# Patient Record
Sex: Female | Born: 2000 | Race: White | Hispanic: No | Marital: Single | Smoking: Current every day smoker
Health system: Southern US, Community
[De-identification: ages and names within clinical notes are randomized; demographics above are authoritative.]

## PROBLEM LIST (undated history)

## (undated) DIAGNOSIS — C801 Malignant (primary) neoplasm, unspecified: Secondary | ICD-10-CM

## (undated) DIAGNOSIS — F32A Depression, unspecified: Secondary | ICD-10-CM

## (undated) DIAGNOSIS — F419 Anxiety disorder, unspecified: Secondary | ICD-10-CM

## (undated) DIAGNOSIS — I499 Cardiac arrhythmia, unspecified: Secondary | ICD-10-CM

## (undated) DIAGNOSIS — J45909 Unspecified asthma, uncomplicated: Secondary | ICD-10-CM

## (undated) DIAGNOSIS — E039 Hypothyroidism, unspecified: Secondary | ICD-10-CM

## (undated) DIAGNOSIS — E079 Disorder of thyroid, unspecified: Secondary | ICD-10-CM

## (undated) HISTORY — DX: Hypothyroidism, unspecified: E03.9

## (undated) HISTORY — DX: Disorder of thyroid, unspecified: E07.9

## (undated) HISTORY — PX: NO PAST SURGERIES: SHX2092

---
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
## 2019-09-03 ENCOUNTER — Ambulatory Visit
Admission: EM | Admit: 2019-09-03 | Discharge: 2019-09-03 | Disposition: A | Discharge status: Home / Self Care | Payer: Medicaid Other | Attending: Emergency Medicine | Admitting: Emergency Medicine

## 2019-09-03 DIAGNOSIS — Z3202 Encounter for pregnancy test, result negative: Secondary | ICD-10-CM

## 2019-09-03 DIAGNOSIS — N939 Abnormal uterine and vaginal bleeding, unspecified: Secondary | ICD-10-CM

## 2019-09-03 DIAGNOSIS — R109 Unspecified abdominal pain: Secondary | ICD-10-CM

## 2019-09-03 LAB — POCT URINALYSIS DIP (MANUAL ENTRY)
Glucose, UA: NEGATIVE mg/dL
Leukocytes, UA: NEGATIVE
Nitrite, UA: NEGATIVE
Protein Ur, POC: 30 mg/dL — AB
Spec Grav, UA: 1.025 (ref 1.010–1.025)
Urobilinogen, UA: 0.2 E.U./dL
pH, UA: 5.5 (ref 5.0–8.0)

## 2019-09-03 LAB — POCT URINE PREGNANCY: Preg Test, Ur: NEGATIVE

## 2019-09-03 MED ORDER — MEDROXYPROGESTERONE ACETATE 10 MG PO TABS: 10.0000 mg | ORAL_TABLET | Freq: Every day | ORAL | 0 refills | Status: DC

## 2019-09-03 MED ORDER — ONDANSETRON HCL 4 MG PO TABS: 4.0000 mg | ORAL_TABLET | Freq: Four times a day (QID) | ORAL | 0 refills | Status: DC

## 2019-09-03 MED ORDER — MELOXICAM 7.5 MG PO TABS: 7.5000 mg | ORAL_TABLET | Freq: Every day | ORAL | 0 refills | Status: DC

## 2019-09-03 NOTE — ED Triage Notes (Signed)
Pt  Took off birth control patch  And has had some bleeding, pt states she had a large amount of tissue last night and is having heavy bleeding , wants to make sure not miscarriage

## 2019-09-03 NOTE — Discharge Instructions (Signed)
Unable to rule out ectopic/ molar pregnancy, ovarian torsion, ovarian cyst, diverticulitis, appendicitis, tuboovarian abscess, etc... in urgent care setting.  Offered patient further evaluation and management in the ED.  Patient declines at this time and would like to try outpatient therapy first.  Aware of the risk associated with this decision including missed diagnosis, organ damage, organ failure, and/or death.  Patient aware and in agreement.     Declines blood tests at this time including quantitative HCG Get rest and drink fluids Zofran as needed for nausea Mobic as needed for pain.  Avoid taking with other NSAIDs at this may cause GI upset Provera prescribed.  Take as directed for vaginal bleeding Follow up with PCP for further evaluation and management Go to the ED if you have fever, chills, nausea, vomiting, persistent vaginal bleeding, worsening pain, diarrhea, etc..Marland Kitchen

## 2019-09-03 NOTE — ED Provider Notes (Signed)
[place]   [id number] 09/03/19 Arrival Time: 0998  CC: Vaginal bleeding  SUBJECTIVE:  Monique Long is a 19 y.o. female who presents with complaint of vaginal bleeding (bleeding through super tampon every 30 minutes) x 4 days, and lower abdominal cramping x 1 week.  Discontinued birth control patch prior to symptoms, last sex 3 days ago, sex also apx 10 days ago. Has tried OTC medications without relief.  Denies alleviating or aggravating factors.  Denies similar symptoms in the past.  Complains of nausea, 1 episode of vomiting, fatigue, nipple tenderness, and mild appetite change.    Denies fever, chills, chest pain, SOB, diarrhea, constipation, dysuria, difficulty urinating, increased frequency or urgency, flank pain, loss of bowel or bladder function, vaginal discharge, vaginal odor, dyspareunia, pelvic pain.     No LMP recorded. (Menstrual status: Other).  ROS: As per HPI.  All other pertinent ROS negative.     No past medical history on file. No past surgical history on file. Allergies  Allergen Reactions  . Penicillins Rash   No current facility-administered medications on file prior to encounter.   No current outpatient medications on file prior to encounter.   Social History   Socioeconomic History  . Marital status: Single    Spouse name: Not on file  . Number of children: Not on file  . Years of education: Not on file  . Highest education level: Not on file  Occupational History  . Not on file  Tobacco Use  . Smoking status: Never Smoker  . Smokeless tobacco: Never Used  Substance and Sexual Activity  . Alcohol use: No  . Drug use: Not Currently  . Sexual activity: Not on file  Other Topics Concern  . Not on file  Social History Narrative  . Not on file   Social Determinants of Health   Financial Resource Strain:   . Difficulty of Paying Living Expenses:   Food Insecurity:   . Worried About Charity fundraiser in the Last Year:   . Youth worker in the Last Year:   Transportation Needs:   . Film/video editor (Medical):   Marland Kitchen Lack of Transportation (Non-Medical):   Physical Activity:   . Days of Exercise per Week:   . Minutes of Exercise per Session:   Stress:   . Feeling of Stress :   Social Connections:   . Frequency of Communication with Friends and Family:   . Frequency of Social Gatherings with Friends and Family:   . Attends Religious Services:   . Active Member of Clubs or Organizations:   . Attends Archivist Meetings:   Marland Kitchen Marital Status:   Intimate Partner Violence:   . Fear of Current or Ex-Partner:   . Emotionally Abused:   Marland Kitchen Physically Abused:   . Sexually Abused:    Family History  Problem Relation Age of Onset  . Cancer Father      OBJECTIVE:  Vitals:   09/03/19 1402  BP: 119/75  Pulse: 67  Resp: 18  Temp: 98.3 F (36.8 C)  SpO2: 98%    General appearance: Alert; NAD HEENT: NCAT.  Oropharynx clear.  Lungs: clear to auscultation bilaterally without adventitious breath sounds Heart: regular rate and rhythm.   Abdomen: soft, non-distended; normal active bowel sounds; mild diffuse TTP over lower abdomen; no guarding Extremities: no edema; symmetrical with no gross deformities Skin: warm and dry Neurologic: normal gait Psychological: alert and cooperative; normal mood and affect  LABS: Results for orders placed or performed during the hospital encounter of 09/03/19 (from the past 24 hour(s))  POCT urinalysis dipstick     Status: Abnormal   Collection Time: 09/03/19  2:11 PM  Result Value Ref Range   Color, UA yellow yellow   Clarity, UA clear clear   Glucose, UA negative negative mg/dL   Bilirubin, UA small (A) negative   Ketones, POC UA small (15) (A) negative mg/dL   Spec Grav, UA 1.025 1.010 - 1.025   Blood, UA trace-intact (A) negative   pH, UA 5.5 5.0 - 8.0   Protein Ur, POC =30 (A) negative mg/dL   Urobilinogen, UA 0.2 0.2 or 1.0 E.U./dL   Nitrite, UA Negative  Negative   Leukocytes, UA Negative Negative  POCT urine pregnancy     Status: None   Collection Time: 09/03/19  2:11 PM  Result Value Ref Range   Preg Test, Ur Negative Negative    ASSESSMENT & PLAN:  1. Abnormal vaginal bleeding   2. Abdominal cramping   3. Negative pregnancy test     Meds ordered this encounter  Medications  . ondansetron (ZOFRAN) 4 MG tablet    Sig: Take 1 tablet (4 mg total) by mouth every 6 (six) hours.    Dispense:  12 tablet    Refill:  0    Order Specific Question:   Supervising Provider    Answer:   Raylene Everts [[id number]]  . meloxicam (MOBIC) 7.5 MG tablet    Sig: Take 1 tablet (7.5 mg total) by mouth daily.    Dispense:  20 tablet    Refill:  0    Order Specific Question:   Supervising Provider    Answer:   Raylene Everts [[id number]]  . medroxyPROGESTERone (PROVERA) 10 MG tablet    Sig: Take 1 tablet (10 mg total) by mouth daily for 10 days.    Dispense:  10 tablet    Refill:  0    Order Specific Question:   Supervising Provider    Answer:   Raylene Everts [[id number]]   Unable to rule out ectopic/ molar pregnancy, ovarian torsion, ovarian cyst, diverticulitis, appendicitis, tuboovarian abscess, etc... in urgent care setting.  Offered patient further evaluation and management in the ED.  Patient declines at this time and would like to try outpatient therapy first.  Aware of the risk associated with this decision including missed diagnosis, organ damage, organ failure, and/or death.  Patient aware and in agreement.     Declines blood tests at this time including quantitative HCG Get rest and drink fluids Zofran as needed for nausea Mobic as needed for pain.  Avoid taking with other NSAIDs at this may cause GI upset Provera prescribed.  Take as directed for vaginal bleeding Follow up with PCP for further evaluation and management Go to the ED if you have fever, chills, nausea, vomiting, persistent vaginal bleeding, worsening pain,  diarrhea, etc...   Reviewed expectations re: course of current medical issues. Questions answered. Outlined signs and symptoms indicating need for more acute intervention. Patient verbalized understanding. After Visit Summary given.   Lestine Box, PA-C 09/03/19 1443

## 2019-09-25 ENCOUNTER — Ambulatory Visit
Admission: EM | Admit: 2019-09-25 | Discharge: 2019-09-25 | Disposition: A | Discharge status: Home / Self Care | Payer: Medicaid Other | Attending: Emergency Medicine | Admitting: Emergency Medicine

## 2019-09-25 ENCOUNTER — Other Ambulatory Visit: Payer: Self-pay

## 2019-09-25 ENCOUNTER — Encounter: Payer: Self-pay | Admitting: Emergency Medicine

## 2019-09-25 DIAGNOSIS — R112 Nausea with vomiting, unspecified: Secondary | ICD-10-CM

## 2019-09-25 DIAGNOSIS — Z3202 Encounter for pregnancy test, result negative: Secondary | ICD-10-CM

## 2019-09-25 LAB — POCT URINE PREGNANCY: Preg Test, Ur: NEGATIVE

## 2019-09-25 MED ORDER — ONDANSETRON HCL 4 MG PO TABS: 4.0000 mg | ORAL_TABLET | Freq: Three times a day (TID) | ORAL | 0 refills | Status: DC | PRN

## 2019-09-25 NOTE — ED Triage Notes (Signed)
Pt reports she has nauseated since yesterday. Pt reports she is not sure if she is pregnant.  Took 2 pregnancy test but didn't show anything.  Would like nausea med and not for work today.

## 2019-09-25 NOTE — ED Provider Notes (Signed)
[place]   [id number] 09/25/19 Arrival Time: 6270  Chief Complaint  Patient presents with  . Nausea     SUBJECTIVE:  Monique Long is a 19 y.o. female who presents presented to the urgent care with a complaint of nausea and vomiting that started yesterday..  Denies a precipitating event, trauma, close contacts with similar symptoms, recent travel or antibiotic use.  Reports she is not sure if she is pregnant.  Has used two home pregnancy test with negative results.  Has not vomited today.  Has not tried OTC medications .  Denies alleviating or aggravating factors.  Denies similar symptoms in the past.  Denies chills, fever, vomiting, diarrhea, abdominal pain, back pain, dysuria, bloody stool or mucoid stool.  No LMP recorded (lmp unknown).  ROS: As per HPI.  All other pertinent ROS negative.     History reviewed. No pertinent past medical history. History reviewed. No pertinent surgical history. Allergies  Allergen Reactions  . Penicillins Rash   No current facility-administered medications on file prior to encounter.   Current Outpatient Medications on File Prior to Encounter  Medication Sig Dispense Refill  . medroxyPROGESTERone (PROVERA) 10 MG tablet Take 1 tablet (10 mg total) by mouth daily for 10 days. 10 tablet 0  . meloxicam (MOBIC) 7.5 MG tablet Take 1 tablet (7.5 mg total) by mouth daily. 20 tablet 0   Social History   Socioeconomic History  . Marital status: Single    Spouse name: Not on file  . Number of children: Not on file  . Years of education: Not on file  . Highest education level: Not on file  Occupational History  . Not on file  Tobacco Use  . Smoking status: Never Smoker  . Smokeless tobacco: Never Used  Substance and Sexual Activity  . Alcohol use: No  . Drug use: Not Currently  . Sexual activity: Not on file  Other Topics Concern  . Not on file  Social History Narrative  . Not on file   Social Determinants of Health    Financial Resource Strain:   . Difficulty of Paying Living Expenses:   Food Insecurity:   . Worried About Charity fundraiser in the Last Year:   . Arboriculturist in the Last Year:   Transportation Needs:   . Film/video editor (Medical):   Marland Kitchen Lack of Transportation (Non-Medical):   Physical Activity:   . Days of Exercise per Week:   . Minutes of Exercise per Session:   Stress:   . Feeling of Stress :   Social Connections:   . Frequency of Communication with Friends and Family:   . Frequency of Social Gatherings with Friends and Family:   . Attends Religious Services:   . Active Member of Clubs or Organizations:   . Attends Archivist Meetings:   Marland Kitchen Marital Status:   Intimate Partner Violence:   . Fear of Current or Ex-Partner:   . Emotionally Abused:   Marland Kitchen Physically Abused:   . Sexually Abused:    Family History  Problem Relation Age of Onset  . Cancer Father      OBJECTIVE:  Vitals:   09/25/19 1425 09/25/19 1428  BP:  95/63  Pulse:  91  Resp:  17  Temp:  98.4 F (36.9 C)  TempSrc:  Oral  SpO2:  98%  Weight: 130 lb (59 kg)   Height: 5\' 5"  (1.651 m)     General appearance: Alert; NAD  HEENT: NCAT.  Oropharynx clear.  Lungs: clear to auscultation bilaterally without adventitious breath sounds Heart: regular rate and rhythm.  Radial pulses 2+ symmetrical bilaterally Abdomen: soft, non-distended; normal active bowel sounds; non-tender to light and deep palpation; nontender at McBurney's point; negative Murphy's sign; negative rebound; no guarding Back: no CVA tenderness Extremities: no edema; symmetrical with no gross deformities Skin: warm and dry Neurologic: normal gait Psychological: alert and cooperative; normal mood and affect  LABS: Results for orders placed or performed during the hospital encounter of 09/25/19 (from the past 24 hour(s))  POCT urine pregnancy     Status: None   Collection Time: 09/25/19  2:34 PM  Result Value Ref Range    Preg Test, Ur Negative Negative    DIAGNOSTIC STUDIES: No results found.   ASSESSMENT & PLAN:  1. Non-intractable vomiting with nausea, unspecified vomiting type   2. Urine pregnancy test negative     Meds ordered this encounter  Medications  . ondansetron (ZOFRAN) 4 MG tablet    Sig: Take 1 tablet (4 mg total) by mouth every 8 (eight) hours as needed for nausea or vomiting.    Dispense:  20 tablet    Refill:  0    Discharge Instructions Urine pregnancy test is negative Get rest and drink fluids Zofran prescribed.  Take as directed.    DIET Instructions:  30 minutes after taking nausea medicine, begin with sips of clear liquids. If able to hold down 2 - 4 ounces for 30 minutes, begin drinking more. Increase your fluid intake to replace losses. Clear liquids only for 24 hours (water, tea, sport drinks, clear flat ginger ale or cola and juices, broth, jello, popsicles, ect). Advance to bland foods, applesauce, rice, baked or boiled chicken, ect. Avoid milk, greasy foods and anything that doesn't agree with you.  If you experience new or worsening symptoms return or go to ER such as fever, chills, nausea, vomiting, diarrhea, bloody or dark tarry stools, constipation, urinary symptoms, worsening abdominal discomfort, symptoms that do not improve with medications, inability to keep fluids down, etc...  Reviewed expectations re: course of current medical issues. Questions answered. Outlined signs and symptoms indicating need for more acute intervention. Patient verbalized understanding. After Visit Summary given.       Note: This document was prepared using Dragon voice recognition software and may include unintentional dictation errors.    Emerson Monte, [place] 09/25/19 1444

## 2019-09-25 NOTE — Discharge Instructions (Addendum)
Urine pregnancy test is negative Get rest and drink fluids Zofran prescribed.  Take as directed.    DIET Instructions:  30 minutes after taking nausea medicine, begin with sips of clear liquids. If able to hold down 2 - 4 ounces for 30 minutes, begin drinking more. Increase your fluid intake to replace losses. Clear liquids only for 24 hours (water, tea, sport drinks, clear flat ginger ale or cola and juices, broth, jello, popsicles, ect). Advance to bland foods, applesauce, rice, baked or boiled chicken, ect. Avoid milk, greasy foods and anything that doesn't agree with you.  If you experience new or worsening symptoms return or go to ER such as fever, chills, nausea, vomiting, diarrhea, bloody or dark tarry stools, constipation, urinary symptoms, worsening abdominal discomfort, symptoms that do not improve with medications, inability to keep fluids down, etc

## 2019-11-24 ENCOUNTER — Ambulatory Visit (INDEPENDENT_AMBULATORY_CARE_PROVIDER_SITE_OTHER): Payer: Medicaid Other | Admitting: "Endocrinology

## 2019-11-24 ENCOUNTER — Other Ambulatory Visit: Payer: Self-pay

## 2019-11-24 ENCOUNTER — Encounter: Payer: Self-pay | Admitting: "Endocrinology

## 2019-11-24 VITALS — BP 100/52 | HR 64 | Ht 66.0 in | Wt 129.6 lb

## 2019-11-24 DIAGNOSIS — E063 Autoimmune thyroiditis: Secondary | ICD-10-CM

## 2019-11-24 DIAGNOSIS — E038 Other specified hypothyroidism: Secondary | ICD-10-CM | POA: Diagnosis not present

## 2019-11-24 MED ORDER — LEVOTHYROXINE SODIUM 100 MCG PO TABS: 100.0000 ug | ORAL_TABLET | Freq: Every day | ORAL | 1 refills | Status: DC

## 2019-11-24 NOTE — Progress Notes (Signed)
Endocrinology Consult Note                                         11/24/2019, 3:39 PM   Monique Long Long is a 19 y.o.-year-old female patient being seen in consultation for hypothyroidism referred by her OB/GYN provider Dr. Evie Lacks.   Past Medical History:  Diagnosis Date  . Hypothyroidism     History reviewed. No pertinent surgical history.  Social History   Socioeconomic History  . Marital status: Single    Spouse name: Not on file  . Number of children: Not on file  . Years of education: Not on file  . Highest education level: Not on file  Occupational History  . Not on file  Tobacco Use  . Smoking status: Never Smoker  . Smokeless tobacco: Never Used  Vaping Use  . Vaping Use: Some days  Substance and Sexual Activity  . Alcohol use: No  . Drug use: Not Currently  . Sexual activity: Not on file  Other Topics Concern  . Not on file  Social History Narrative  . Not on file   Social Determinants of Health   Financial Resource Strain:   . Difficulty of Paying Living Expenses: Not on file  Food Insecurity:   . Worried About Charity fundraiser in the Last Year: Not on file  . Ran Out of Food in the Last Year: Not on file  Transportation Needs:   . Lack of Transportation (Medical): Not on file  . Lack of Transportation (Non-Medical): Not on file  Physical Activity:   . Days of Exercise per Week: Not on file  . Minutes of Exercise per Session: Not on file  Stress:   . Feeling of Stress : Not on file  Social Connections:   . Frequency of Communication with Friends and Family: Not on file  . Frequency of Social Gatherings with Friends and Family: Not on file  . Attends Religious Services: Not on file  . Active Member of Clubs or Organizations: Not on file  . Attends Archivist Meetings: Not on file  . Marital Status: Not on file    Family History  Problem Relation Age of Onset   . Hypertension Mother   . Heart failure Mother   . Cancer Father     Outpatient Encounter Medications as of 11/24/2019  Medication Sig  . levothyroxine (SYNTHROID) 100 MCG tablet Take 1 tablet (100 mcg total) by mouth daily before breakfast.  . [DISCONTINUED] levothyroxine (SYNTHROID) 125 MCG tablet Take 1 tablet by mouth daily.  . medroxyPROGESTERone (PROVERA) 10 MG tablet Take 1 tablet (10 mg total) by mouth daily for 10 days.  . ondansetron (ZOFRAN) 4 MG tablet Take 1 tablet (4 mg total) by mouth every 8 (eight) hours as needed for nausea or vomiting.  . [DISCONTINUED] meloxicam (MOBIC) 7.5 MG tablet Take 1 tablet (7.5 mg total) by mouth daily.   No facility-administered encounter  medications on file as of 11/24/2019.    ALLERGIES: Allergies  Allergen Reactions  . Amoxicillin   . Penicillins Rash   VACCINATION STATUS:  There is no immunization history on file for this patient.   HPI    Monique Long Long  is a patient with the above medical history. she was diagnosed  with hypothyroidism approximately a month ago after she dealt with symptoms of fatigue, cold intolerance, constipation. She is 6 months postpartum.  She was initiated and still on levothyroxine 125 mcg p.o. daily with significant clinical improvement.  -She is currently dealing with some anxiety, tremors, palpitations.  Her weight is also fluctuating. I reviewed patient's  thyroid tests: Her most recent TSH on November 18, 2019 was 3.9 improving from 211 on October 11, 2019.  She did have high antithyroid antibodies of TPO at 238 in September 2021.  Pt denies feeling nodules in neck, hoarseness, dysphagia/odynophagia, SOB with lying down.  she reports family history of thyroid dysfunction in one of her aunts taking thyroid hormone replacement.  - No family history of thyroid cancer.  No history of  radiation therapy to head or neck. No recent use of iodine supplements.   ROS:  Constitutional:  + Fluctuating body  weight, + fatigue, no subjective hyperthermia, no subjective hypothermia Eyes: no blurry vision, no xerophthalmia ENT: no sore throat, no nodules palpated in throat, no dysphagia/odynophagia, no hoarseness Cardiovascular: no Chest Pain, no Shortness of Breath, no palpitations, no leg swelling Respiratory: no cough, no SOB Gastrointestinal: no Nausea/Vomiting/Diarhhea Musculoskeletal: no muscle/joint aches Skin: no rashes Neurological: no tremors, no numbness, no tingling, no dizziness Psychiatric: no depression, no anxiety   Physical Exam: BP (!) 100/52   Pulse 64   Ht 5\' 6"  (1.676 m)   Wt 129 lb 9.6 oz (58.8 kg)   BMI 20.92 kg/m  Wt Readings from Last 3 Encounters:  11/24/19 129 lb 9.6 oz (58.8 kg) (53 %, Z= 0.07)*  09/25/19 130 lb (59 kg) (54 %, Z= 0.10)*  02/21/15 130 lb (59 kg) (73 %, Z= 0.63)*   * Growth percentiles are based on CDC (Girls, 2-20 Years) data.    Constitutional:  Body mass index is 20.92 kg/m., not in acute distress, normal state of mind, + tattoos Eyes: PERRLA, EOMI, no exophthalmos ENT: moist mucous membranes, no thyromegaly, no cervical lymphadenopathy Cardiovascular: normal precordial activity, Regular Rate and Rhythm, no Murmur/Rubs/Gallops Respiratory:  adequate breathing efforts, no gross chest deformity, Clear to auscultation bilaterally Gastrointestinal: abdomen soft, Non -tender, No distension, Bowel Sounds present Musculoskeletal: no gross deformities, strength intact in all four extremities Skin: moist, warm, no rashes Neurological: no tremor with outstretched hands, Deep tendon reflexes normal in all four extremities.     ASSESSMENT: 1. Hypothyroidism 2.  Hashimoto's thyroiditis  PLAN:    Patient with recent onset hypothyroidism related to Hashimoto's thyroiditis.  She is currently on levothyroxine likely over replacement dose.   She is approached for lower adjustment on her levothyroxine.  I discussed and lowered it to 100 mcg p.o.  daily before breakfast.    - We discussed about correct intake of levothyroxine, at fasting, with water, separated by at least 30 minutes from breakfast, and separated by more than 4 hours from calcium, iron, multivitamins, acid reflux medications (PPIs). -Patient is made aware of the fact that thyroid hormone replacement is needed for life, dose to be adjusted by periodic monitoring of thyroid function tests. - Will check thyroid tests before next visit: TSH, free T4  before her next visit in 3 months. -Due to absence of clinical goiter, no need for thyroid ultrasound.  She is currently 6 months postpartum, not planning to get pregnant anytime soon, advised to call clinic for adjustment of her thyroid hormone if she confirms any pregnancy.  - Time spent with the patient: 60 minutes, of which >50% was spent in obtaining information about her symptoms, reviewing her previous labs, evaluations, and treatments, counseling her about her hypothyroidism, Hashimoto's thyroiditis, and developing a plan to confirm the diagnosis and long term treatment as necessary. Please refer to " Patient Self Inventory" in the Media  tab for reviewed elements of pertinent patient history.  Monique Long Long participated in the discussions, expressed understanding, and voiced agreement with the above plans.  All questions were answered to her satisfaction. she is encouraged to contact clinic should she have any questions or concerns prior to her return visit.  Return in about 3 months (around 02/23/2020) for F/U with Pre-visit Labs.  Glade Lloyd, MD [facility] [address] [place], [place] [address] Phone: [phone]  Fax: [phone]   11/24/2019, 3:39 PM  This note was partially dictated with voice recognition software. Similar sounding words can be transcribed inadequately or may not  be corrected upon review.

## 2019-12-29 ENCOUNTER — Other Ambulatory Visit: Payer: Self-pay | Admitting: "Endocrinology

## 2019-12-29 ENCOUNTER — Telehealth: Payer: Self-pay | Admitting: "Endocrinology

## 2019-12-29 DIAGNOSIS — E038 Other specified hypothyroidism: Secondary | ICD-10-CM

## 2019-12-29 DIAGNOSIS — E063 Autoimmune thyroiditis: Secondary | ICD-10-CM

## 2019-12-29 NOTE — Telephone Encounter (Signed)
I talked with patient, she already has a lab scheduled with her OB 10/28 and will get them done then. Patient is sch for a phone visit 11/1

## 2019-12-29 NOTE — Telephone Encounter (Signed)
I want her to go to lab for TSH, free T4 and we can do a phone visit.

## 2019-12-29 NOTE — Telephone Encounter (Signed)
Pt has a follow up in December and states she does not feel any better and that she has no energy at all. She would like a call back.

## 2019-12-29 NOTE — Telephone Encounter (Signed)
Pt states she was seen in September and her dose of levothyroxine was decreased to 126mcg. States she is is not feeling any better, she's experiencing worsening fatigue, worsening vaginal dryness and irritation. Pt has an appointment in December but would like to be seen before then.

## 2020-01-07 LAB — TSH: TSH: 2.89 (ref 0.41–5.90)

## 2020-01-10 ENCOUNTER — Ambulatory Visit (INDEPENDENT_AMBULATORY_CARE_PROVIDER_SITE_OTHER): Payer: Medicaid Other | Admitting: "Endocrinology

## 2020-01-10 ENCOUNTER — Telehealth: Payer: Self-pay

## 2020-01-10 ENCOUNTER — Encounter: Payer: Self-pay | Admitting: "Endocrinology

## 2020-01-10 DIAGNOSIS — E063 Autoimmune thyroiditis: Secondary | ICD-10-CM | POA: Diagnosis not present

## 2020-01-10 DIAGNOSIS — E038 Other specified hypothyroidism: Secondary | ICD-10-CM | POA: Diagnosis not present

## 2020-01-10 MED ORDER — PREDNISONE 10 MG PO TABS: 10.0000 mg | ORAL_TABLET | Freq: Every day | ORAL | 0 refills | Status: DC

## 2020-01-10 NOTE — Telephone Encounter (Signed)
Pt called & stated you wanted her to see someone for her stomach. She would like to know who that is? I did not see anything in the chart pertaining to this , please advise

## 2020-01-10 NOTE — Progress Notes (Signed)
01/10/2020, 3:47 PM                                 Endocrinology Telehealth Visit Follow up Note -During COVID -19 Pandemic  This visit type was conducted  via telephone due to national recommendations for restrictions regarding the COVID-19 Pandemic  in an effort to limit this patient's exposure and mitigate transmission of the corona virus.   I connected with [place] on 01/10/2020   by telephone and verified that I am speaking with the correct person using two identifiers. Monique Long, 04-May-2000. she has verbally consented to this visit.  I was in my office and patient was in her residence. No other persons were with me during the encounter. All issues noted in this document were discussed and addressed. The format was not optimal for physical exam.   Monique Long is a 19 y.o.-year-old female patient being engaged in telehealth after she was seen in consultation for hypothyroidism due to Hashimoto's thyroiditis.    Past Medical History:  Diagnosis Date  . Hypothyroidism     History reviewed. No pertinent surgical history.  Social History   Socioeconomic History  . Marital status: Single    Spouse name: Not on file  . Number of children: Not on file  . Years of education: Not on file  . Highest education level: Not on file  Occupational History  . Not on file  Tobacco Use  . Smoking status: Never Smoker  . Smokeless tobacco: Never Used  Vaping Use  . Vaping Use: Some days  Substance and Sexual Activity  . Alcohol use: No  . Drug use: Not Currently  . Sexual activity: Not on file  Other Topics Concern  . Not on file  Social History Narrative  . Not on file   Social Determinants of Health   Financial Resource Strain:   . Difficulty of Paying Living Expenses: Not on file  Food Insecurity:   . Worried About Charity fundraiser in the Last Year: Not  on file  . Ran Out of Food in the Last Year: Not on file  Transportation Needs:   . Lack of Transportation (Medical): Not on file  . Lack of Transportation (Non-Medical): Not on file  Physical Activity:   . Days of Exercise per Week: Not on file  . Minutes of Exercise per Session: Not on file  Stress:   . Feeling of Stress : Not on file  Social Connections:   . Frequency of Communication with Friends and Family: Not on file  . Frequency of Social Gatherings with Friends and Family: Not on file  . Attends Religious Services: Not on file  . Active Member of Clubs or Organizations: Not on file  .  Attends Archivist Meetings: Not on file  . Marital Status: Not on file    Family History  Problem Relation Age of Onset  . Hypertension Mother   . Heart failure Mother   . Cancer Father     Outpatient Encounter Medications as of 01/10/2020  Medication Sig  . levothyroxine (SYNTHROID) 100 MCG tablet Take 1 tablet (100 mcg total) by mouth daily before breakfast.  . medroxyPROGESTERone (PROVERA) 10 MG tablet Take 1 tablet (10 mg total) by mouth daily for 10 days.  . ondansetron (ZOFRAN) 4 MG tablet Take 1 tablet (4 mg total) by mouth every 8 (eight) hours as needed for nausea or vomiting.  . predniSONE (DELTASONE) 10 MG tablet Take 1 tablet (10 mg total) by mouth daily with breakfast.   No facility-administered encounter medications on file as of 01/10/2020.    ALLERGIES: Allergies  Allergen Reactions  . Amoxicillin   . Penicillins Rash   VACCINATION STATUS:  There is no immunization history on file for this patient.   HPI    Wilkinson  is a patient with the above medical history. she was diagnosed  with hypothyroidism approximately 2 months ago after significant suggestive symptoms led to measurement of thyroid function tests.  During her last visit, her levothyroxine was lowered to 100 mcg p.o. daily due to iatrogenic thyrotoxicosis.   Patient still reports  anxiety, nausea, unintended weight loss.  Her previsit labs are consistent with appropriate replacement. -After encounter was closed, she called and reported that she actually has neck pain. Pt denies feeling nodules in neck, hoarseness, dysphagia/odynophagia, SOB with lying down.  she reports family history of thyroid dysfunction in one of her aunts taking thyroid hormone replacement.  - No family history of thyroid cancer.  No history of  radiation therapy to head or neck. No recent use of iodine supplements.   ROS: Limited as above.  Physical Exam: There were no vitals taken for this visit. Wt Readings from Last 3 Encounters:  11/24/19 129 lb 9.6 oz (58.8 kg) (53 %, Z= 0.07)*  09/25/19 130 lb (59 kg) (54 %, Z= 0.10)*  02/21/15 130 lb (59 kg) (73 %, Z= 0.63)*   * Growth percentiles are based on CDC (Girls, 2-20 Years) data.          ASSESSMENT: 1. Hypothyroidism 2.  Hashimoto's thyroiditis  PLAN:  She likely is dealing with thyroiditis.  She is given a brief course of prednisone 10 mg p.o. daily for 15 days.   Her previsit thyroid function tests are consistent with appropriate replacement.  She is advised to continue levothyroxine 100 mcg p.o. daily before breakfast.  She is asked to return for physical exam, reports that she will call back for a visit.  - We discussed about the correct intake of her thyroid hormone, on empty stomach at fasting, with water, separated by at least 30 minutes from breakfast and other medications,  and separated by more than 4 hours from calcium, iron, multivitamins, acid reflux medications (PPIs). -Patient is made aware of the fact that thyroid hormone replacement is needed for life, dose to be adjusted by periodic monitoring of thyroid function tests.   -She will be considered for thyroid ultrasound depending on physical exam findings.    -This patient is 8 months postpartum, likely dealing with some mood disorder.  She may benefit from  evaluation for general anxiety.  She was tearful and crying during this telephone encounter, out of proportion for the thyroid  dysfunction she has.   She is advised to maintain close follow-up with her PCP.     - Time spent on this patient care encounter:  20 minutes of which 50% was spent in  counseling and the rest reviewing  her current and  previous labs / studies and medications  doses and developing a plan for long term care. Monique Long  participated in the discussions, expressed understanding, and voiced agreement with the above plans.  All questions were answered to her satisfaction. she is encouraged to contact clinic should she have any questions or concerns prior to her return visit.   Return in about 3 months (around 04/11/2020) for F/U with Pre-visit Labs.  Glade Lloyd, MD [facility] [address] [place], [place] [address] Phone: [phone]  Fax: [phone]   01/10/2020, 3:47 PM  This note was partially dictated with voice recognition software. Similar sounding words can be transcribed inadequately or may not  be corrected upon review.

## 2020-01-11 NOTE — Telephone Encounter (Signed)
Return call to pt- no answer

## 2020-01-11 NOTE — Telephone Encounter (Signed)
I suggested for her to start by seeing her PCP and they will decide if she needs that consult.

## 2020-02-23 ENCOUNTER — Ambulatory Visit (INDEPENDENT_AMBULATORY_CARE_PROVIDER_SITE_OTHER): Payer: Medicaid Other | Admitting: Internal Medicine

## 2020-02-23 ENCOUNTER — Other Ambulatory Visit: Payer: Self-pay

## 2020-02-23 ENCOUNTER — Encounter: Payer: Self-pay | Admitting: Internal Medicine

## 2020-02-23 VITALS — BP 106/76 | HR 84 | Temp 97.9°F | Resp 18 | Ht 65.0 in | Wt 124.4 lb

## 2020-02-23 DIAGNOSIS — E039 Hypothyroidism, unspecified: Secondary | ICD-10-CM | POA: Diagnosis not present

## 2020-02-23 DIAGNOSIS — N926 Irregular menstruation, unspecified: Secondary | ICD-10-CM | POA: Diagnosis not present

## 2020-02-23 DIAGNOSIS — Z7689 Persons encountering health services in other specified circumstances: Secondary | ICD-10-CM | POA: Diagnosis not present

## 2020-02-23 NOTE — Assessment & Plan Note (Signed)
Care established Previous chart reviewed History and medications reviewed with the patient 

## 2020-02-23 NOTE — Assessment & Plan Note (Addendum)
Home UPT negative 2 weeks ago Currently on day 3 of her menstrual cycle Reports having no menstrual cycles for 2-3 months sometimes Follows up with OB/GYN, was on Provera in the past, states it made menstruation problem worse. Can be partly contributed by thyroiditis. Check TSH and free T4

## 2020-02-23 NOTE — Progress Notes (Signed)
New Patient Office Visit  Subjective:  Patient ID: Monique Long, female    DOB: May 16, 2000  Age: 19 y.o. MRN: [id number]  CC:  Chief Complaint  Patient presents with  . New Patient (Initial Visit)    New patient pt is concerned because she is losing weight she does see nida for thyroid issues     HPI Monique Long is a 19 year old female with PMH of hypothyroidism (Hashimoto thyroiditis) and irregular menstrual cycles who presents for establishing care.  She states that she has been losing weight for last few months.  Her weight used to be around 130 pounds in the past.  She has been eating well throughout the day and has good appetite.  She was found to have hypothyroidism about 6 months ago and was started on levothyroxine 125 mcg QD, which was later decreased to 100 mcg once daily as it was over supplemented.  She follows up with Dr. Dorris Fetch for hypothyroidism.  She reports having tenderness in the thyroid area about a month ago, for which she was prescribed prednisone and she did not pick it up.  She denies any pain in the neck currently.  She denies dysphagia, odynophagia, dysphonia or dyspnea.  She denies nausea, vomiting, abdominal pain or diarrhea.  She feels tired most of the day.  Of note, she has a history of irregular menstrual cycles, for which she visits OB/GYN in [place].  She was started on Provera in the past, but states that it made her menstrual cycles worse and she stopped taking them.  She is currently on day 3 of her menstrual cycle, which occurred after 2 months.  She denies any blood clots or heavy menstrual bleeding currently.  Past Medical History:  Diagnosis Date  . Hypothyroidism     History reviewed. No pertinent surgical history.  Family History  Problem Relation Age of Onset  . Hypertension Mother   . Heart failure Mother   . Cancer Father     Social History   Socioeconomic History  . Marital status: Single    Spouse name: Not on file  . Number of  children: Not on file  . Years of education: Not on file  . Highest education level: Not on file  Occupational History  . Not on file  Tobacco Use  . Smoking status: Never Smoker  . Smokeless tobacco: Never Used  Vaping Use  . Vaping Use: Some days  Substance and Sexual Activity  . Alcohol use: No  . Drug use: Not Currently  . Sexual activity: Not on file  Other Topics Concern  . Not on file  Social History Narrative  . Not on file   Social Determinants of Health   Financial Resource Strain: Not on file  Food Insecurity: Not on file  Transportation Needs: Not on file  Physical Activity: Not on file  Stress: Not on file  Social Connections: Not on file  Intimate Partner Violence: Not on file    ROS Review of Systems  Constitutional: Positive for fatigue and unexpected weight change. Negative for chills and fever.  HENT: Negative for congestion, sinus pressure, sinus pain and sore throat.   Eyes: Negative for pain and discharge.  Respiratory: Negative for cough and shortness of breath.   Cardiovascular: Negative for chest pain and palpitations.  Gastrointestinal: Negative for abdominal pain, constipation, diarrhea, nausea and vomiting.  Endocrine: Negative for polydipsia and polyuria.  Genitourinary: Positive for menstrual problem. Negative for dysuria, hematuria, pelvic pain and  vaginal discharge.  Musculoskeletal: Negative for neck pain and neck stiffness.  Skin: Negative for rash.  Neurological: Negative for dizziness, speech difficulty, weakness and headaches.  Psychiatric/Behavioral: Negative for agitation and behavioral problems.    Objective:   Today's Vitals: BP 106/76 (BP Location: Right Arm, Patient Position: Sitting, Cuff Size: Normal)   Pulse 84   Temp 97.9 F (36.6 C) (Oral)   Resp 18   Ht _0  (1.651 m)   Wt 124 lb 6.4 oz (56.4 kg)   SpO2 100%   BMI 20.70 kg/m   Physical Exam Vitals reviewed.  Constitutional:      General: She is not in acute  distress.    Appearance: She is not diaphoretic.  HENT:     Head: Normocephalic and atraumatic.     Nose: Nose normal.     Mouth/Throat:     Mouth: Mucous membranes are moist.  Eyes:     General: No scleral icterus.    Extraocular Movements: Extraocular movements intact.     Pupils: Pupils are equal, round, and reactive to light.  Neck:     Thyroid: Thyromegaly present. No thyroid tenderness.  Cardiovascular:     Rate and Rhythm: Normal rate and regular rhythm.     Pulses: Normal pulses.     Heart sounds: Normal heart sounds. No murmur heard.   Pulmonary:     Breath sounds: Normal breath sounds. No wheezing or rales.  Abdominal:     Palpations: Abdomen is soft.     Tenderness: There is no abdominal tenderness.  Musculoskeletal:     Cervical back: Neck supple. No tenderness.     Right lower leg: No edema.     Left lower leg: No edema.  Skin:    General: Skin is warm.     Findings: No rash.  Neurological:     General: No focal deficit present.     Mental Status: She is alert and oriented to person, place, and time.  Psychiatric:        Mood and Affect: Mood normal.        Behavior: Behavior normal.     Assessment & Plan:   Problem List Items Addressed This Visit      Encounter to establish care - Primary   Care established Previous chart reviewed History and medications reviewed with the patient     Relevant Orders  CBC with Differential  CMP14+EGFR  Hemoglobin A1c  Lipid panel  Vitamin D (25 hydroxy)    Endocrine   Hypothyroidism    On Levothyroxine 100 mcg QD Reports weight loss and menstrual irregularity Check TSH and free T4 Check thyroid US Follows up with Dr Dorris Fetch      Relevant Orders   US THYROID     Other      Menstruation, irregular    Home UPT negative 2 weeks ago Currently on day 3 of her menstrual cycle Reports having no menstrual cycles for 2-3 months sometimes Follows up with OB/GYN, was on Provera in the past, states it made  menstruation problem worse. Can be partly contributed by thyroiditis. Check TSH and free T4         Outpatient Encounter Medications as of 02/23/2020  Medication Sig  . levothyroxine (SYNTHROID) 100 MCG tablet Take 1 tablet (100 mcg total) by mouth daily before breakfast.  . ondansetron (ZOFRAN) 4 MG tablet Take 1 tablet (4 mg total) by mouth every 8 (eight) hours as needed for nausea or vomiting.  . [  DISCONTINUED] medroxyPROGESTERone (PROVERA) 10 MG tablet Take 1 tablet (10 mg total) by mouth daily for 10 days.  . [DISCONTINUED] predniSONE (DELTASONE) 10 MG tablet Take 1 tablet (10 mg total) by mouth daily with breakfast. (Patient not taking: Reported on 02/23/2020)   No facility-administered encounter medications on file as of 02/23/2020.    Follow-up: Return in about 6 months (around 08/23/2020).   Lindell Spar, MD

## 2020-02-23 NOTE — Patient Instructions (Signed)
Please continue to take Levothyroxine as prescribed. We will decide based on the blood tests whether it needs adjustment.  You are being scheduled to get Ultrasound of the thyroid done.  Please take [facility]' health Multivitamin daily. Okay to use protein supplements like Ensure.  Please continue to follow up with your OB/GYN and Endocrinologist.

## 2020-02-23 NOTE — Assessment & Plan Note (Signed)
On Levothyroxine 100 mcg QD Reports weight loss and menstrual irregularity Check TSH and free T4 Check thyroid US Follows up with Dr Dorris Fetch

## 2020-02-24 ENCOUNTER — Ambulatory Visit: Payer: Medicaid Other | Admitting: "Endocrinology

## 2020-02-24 LAB — CBC WITH DIFFERENTIAL/PLATELET
Basophils Absolute: 0 10*3/uL (ref 0.0–0.2)
Basos: 0 %
EOS (ABSOLUTE): 0.2 10*3/uL (ref 0.0–0.4)
Eos: 4 %
Hematocrit: 37.4 % (ref 34.0–46.6)
Hemoglobin: 12.3 g/dL (ref 11.1–15.9)
Immature Grans (Abs): 0 10*3/uL (ref 0.0–0.1)
Immature Granulocytes: 0 %
Lymphocytes Absolute: 1.6 10*3/uL (ref 0.7–3.1)
Lymphs: 33 %
MCH: 28 pg (ref 26.6–33.0)
MCHC: 32.9 g/dL (ref 31.5–35.7)
MCV: 85 fL (ref 79–97)
Monocytes Absolute: 0.4 10*3/uL (ref 0.1–0.9)
Monocytes: 8 %
Neutrophils Absolute: 2.7 10*3/uL (ref 1.4–7.0)
Neutrophils: 55 %
Platelets: 254 10*3/uL (ref 150–450)
RBC: 4.4 x10E6/uL (ref 3.77–5.28)
RDW: 13.1 % (ref 11.7–15.4)
WBC: 5 10*3/uL (ref 3.4–10.8)

## 2020-02-24 LAB — CMP14+EGFR
ALT: 16 IU/L (ref 0–32)
AST: 15 IU/L (ref 0–40)
Albumin/Globulin Ratio: 2 (ref 1.2–2.2)
Albumin: 4.7 g/dL (ref 3.9–5.0)
Alkaline Phosphatase: 69 IU/L (ref 42–106)
BUN/Creatinine Ratio: 15 (ref 9–23)
BUN: 13 mg/dL (ref 6–20)
Bilirubin Total: <0.2 mg/dL (ref 0.0–1.2)
CO2: 21 mmol/L (ref 20–29)
Calcium: 9.4 mg/dL (ref 8.7–10.2)
Chloride: 104 mmol/L (ref 96–106)
Creatinine, Ser: 0.84 mg/dL (ref 0.57–1.00)
GFR calc Af Amer: 116 mL/min/{1.73_m2} (ref >59)
GFR calc non Af Amer: 100 mL/min/{1.73_m2} (ref >59)
Globulin, Total: 2.3 g/dL (ref 1.5–4.5)
Glucose: 86 mg/dL (ref 65–99)
Potassium: 4.5 mmol/L (ref 3.5–5.2)
Sodium: 139 mmol/L (ref 134–144)
Total Protein: 7 g/dL (ref 6.0–8.5)

## 2020-02-24 LAB — HEMOGLOBIN A1C
Est. average glucose Bld gHb Est-mCnc: 114 mg/dL
Hgb A1c MFr Bld: 5.6 % (ref 4.8–5.6)

## 2020-02-24 LAB — VITAMIN D 25 HYDROXY (VIT D DEFICIENCY, FRACTURES): Vit D, 25-Hydroxy: 23.9 ng/mL — ABNORMAL LOW (ref 30.0–100.0)

## 2020-02-24 LAB — LIPID PANEL
Chol/HDL Ratio: 3.3 ratio (ref 0.0–4.4)
Cholesterol, Total: 147 mg/dL (ref 100–199)
HDL: 44 mg/dL (ref >39)
LDL Chol Calc (NIH): 90 mg/dL (ref 0–99)
Triglycerides: 67 mg/dL (ref 0–149)
VLDL Cholesterol Cal: 13 mg/dL (ref 5–40)

## 2020-02-28 ENCOUNTER — Telehealth: Payer: Self-pay | Admitting: Internal Medicine

## 2020-02-28 NOTE — Telephone Encounter (Signed)
Patient calling wanting to know the results of labs she had last week  P# [phone]

## 2020-02-28 NOTE — Telephone Encounter (Signed)
Pt notified of lab results with verbal understanding  

## 2020-03-01 ENCOUNTER — Telehealth: Payer: Medicaid Other | Admitting: Internal Medicine

## 2020-03-01 ENCOUNTER — Other Ambulatory Visit: Payer: Self-pay

## 2020-03-09 ENCOUNTER — Ambulatory Visit (HOSPITAL_COMMUNITY): Payer: Medicaid Other

## 2020-03-28 ENCOUNTER — Other Ambulatory Visit: Payer: Self-pay

## 2020-03-28 ENCOUNTER — Ambulatory Visit
Admission: EM | Admit: 2020-03-28 | Discharge: 2020-03-28 | Disposition: A | Discharge status: Home / Self Care | Payer: Medicaid Other | Attending: Family Medicine | Admitting: Family Medicine

## 2020-03-28 ENCOUNTER — Encounter: Payer: Self-pay | Admitting: Emergency Medicine

## 2020-03-28 DIAGNOSIS — B349 Viral infection, unspecified: Secondary | ICD-10-CM | POA: Diagnosis not present

## 2020-03-28 DIAGNOSIS — R509 Fever, unspecified: Secondary | ICD-10-CM | POA: Diagnosis not present

## 2020-03-28 DIAGNOSIS — R059 Cough, unspecified: Secondary | ICD-10-CM

## 2020-03-28 DIAGNOSIS — R5383 Other fatigue: Secondary | ICD-10-CM

## 2020-03-28 NOTE — Discharge Instructions (Signed)
Your COVID test is pending.  You should self quarantine until the test result is back.    Take Tylenol or ibuprofen as needed for fever or discomfort.  Rest and keep yourself hydrated.    Follow-up with your primary care provider if your symptoms are not improving.     

## 2020-03-28 NOTE — ED Provider Notes (Signed)
[facility]   [id number] 03/28/20 Arrival Time: 6629   CC: COVID symptoms  SUBJECTIVE: History from: patient.  Monique Long is a 20 y.o. female who presents with cough, fever since yesterday. Reports loss of sense of taste for the last week. Denies sick exposure to COVID, flu or strep. Denies recent travel. Has negative history of Covid. Has not completed Covid vaccines. Has not taken OTC medications for this. There are no aggravating or alleviating factors. Denies previous symptoms in the past. Denies chills, fatigue, sinus pain, rhinorrhea, SOB, wheezing, chest pain, nausea, changes in bowel or bladder habits.    ROS: As per HPI.  All other pertinent ROS negative.     Past Medical History:  Diagnosis Date  . Hypothyroidism   . Thyroid disease    [employer] 02/29/2020   History reviewed. No pertinent surgical history. Allergies  Allergen Reactions  . Amoxicillin   . Penicillins Rash   No current facility-administered medications on file prior to encounter.   Current Outpatient Medications on File Prior to Encounter  Medication Sig Dispense Refill  . levothyroxine (SYNTHROID) 100 MCG tablet Take 1 tablet (100 mcg total) by mouth daily before breakfast. 90 tablet 1  . ondansetron (ZOFRAN) 4 MG tablet Take 1 tablet (4 mg total) by mouth every 8 (eight) hours as needed for nausea or vomiting. 20 tablet 0   Social History   Socioeconomic History  . Marital status: Single    Spouse name: Not on file  . Number of children: Not on file  . Years of education: Not on file  . Highest education level: Not on file  Occupational History  . Not on file  Tobacco Use  . Smoking status: Never Smoker  . Smokeless tobacco: Never Used  Vaping Use  . Vaping Use: Some days  Substance and Sexual Activity  . Alcohol use: No  . Drug use: Not Currently  . Sexual activity: Not on file  Other Topics Concern  . Not on file  Social History Narrative  . Not on file   Social  Determinants of Health   Financial Resource Strain: Not on file  Food Insecurity: Not on file  Transportation Needs: Not on file  Physical Activity: Not on file  Stress: Not on file  Social Connections: Not on file  Intimate Partner Violence: Not on file   Family History  Problem Relation Age of Onset  . Hypertension Mother   . Heart failure Mother   . Cancer Father     OBJECTIVE:  Vitals:   03/28/20 1430  BP: 104/67  Pulse: (!) 56  Resp: 18  Temp: 98.3 F (36.8 C)  TempSrc: Oral  SpO2: 98%     General appearance: alert; appears fatigued, but nontoxic; speaking in full sentences and tolerating own secretions HEENT: NCAT; Ears: EACs clear, TMs pearly gray; Eyes: PERRL.  EOM grossly intact. Sinuses: nontender; Nose: nares patent with clear rhinorrhea, Throat: oropharynx erythematous, cobblestoning present, tonsils non erythematous or enlarged, uvula midline  Neck: supple without LAD Lungs: unlabored respirations, symmetrical air entry; cough: absent; no respiratory distress; CTAB Heart: regular rate and rhythm.  Radial pulses 2+ symmetrical bilaterally Skin: warm and dry Psychological: alert and cooperative; normal mood and affect  LABS:  No results found for this or any previous visit (from the past 24 hour(s)).   ASSESSMENT & PLAN:  1. Viral illness   2. Cough   3. Fever, unspecified fever cause   4. Other fatigue  Continue supportive care at home COVID and flu testing ordered.  It will take between 2-3 days for test results. Someone will contact you regarding abnormal results.   Work note provided Patient should remain in quarantine until they have received Covid results.  If negative you may resume normal activities (go back to work/school) while practicing hand hygiene, social distance, and mask wearing.  If positive, patient should remain in quarantine for at least 5 days from symptom onset AND greater than 72 hours after symptoms resolution (absence of  fever without the use of fever-reducing medication and improvement in respiratory symptoms), whichever is longer Get plenty of rest and push fluids Use OTC zyrtec for nasal congestion, runny nose, and/or sore throat Use OTC flonase for nasal congestion and runny nose Use medications daily for symptom relief Use OTC medications like ibuprofen or tylenol as needed fever or pain Call or go to the ED if you have any new or worsening symptoms such as fever, worsening cough, shortness of breath, chest tightness, chest pain, turning blue, changes in mental status.  Reviewed expectations re: course of current medical issues. Questions answered. Outlined signs and symptoms indicating need for more acute intervention. Patient verbalized understanding. After Visit Summary given.         Faustino Congress, NP 03/29/20 2037

## 2020-03-28 NOTE — ED Triage Notes (Signed)
Cough, sore throat and fever since last night. Loss of taste x 1 week

## 2020-03-30 LAB — COVID-19, FLU A+B NAA
Influenza A, NAA: NOT DETECTED
Influenza B, NAA: NOT DETECTED
SARS-CoV-2, NAA: NOT DETECTED

## 2020-04-11 ENCOUNTER — Ambulatory Visit: Payer: Medicaid Other | Admitting: "Endocrinology

## 2020-05-19 ENCOUNTER — Ambulatory Visit
Admission: EM | Admit: 2020-05-19 | Discharge: 2020-05-19 | Disposition: A | Discharge status: Home / Self Care | Payer: Medicaid Other | Attending: Emergency Medicine | Admitting: Emergency Medicine

## 2020-05-19 ENCOUNTER — Encounter: Payer: Self-pay | Admitting: Emergency Medicine

## 2020-05-19 ENCOUNTER — Other Ambulatory Visit: Payer: Self-pay

## 2020-05-19 DIAGNOSIS — R112 Nausea with vomiting, unspecified: Secondary | ICD-10-CM

## 2020-05-19 DIAGNOSIS — Z20822 Contact with and (suspected) exposure to covid-19: Secondary | ICD-10-CM | POA: Diagnosis not present

## 2020-05-19 DIAGNOSIS — J029 Acute pharyngitis, unspecified: Secondary | ICD-10-CM

## 2020-05-19 DIAGNOSIS — R52 Pain, unspecified: Secondary | ICD-10-CM

## 2020-05-19 DIAGNOSIS — J069 Acute upper respiratory infection, unspecified: Secondary | ICD-10-CM

## 2020-05-19 DIAGNOSIS — E039 Hypothyroidism, unspecified: Secondary | ICD-10-CM

## 2020-05-19 DIAGNOSIS — Z1152 Encounter for screening for COVID-19: Secondary | ICD-10-CM

## 2020-05-19 LAB — POCT RAPID STREP A (OFFICE): Rapid Strep A Screen: NEGATIVE

## 2020-05-19 MED ORDER — PREDNISONE 10 MG PO TABS: 20.0000 mg | ORAL_TABLET | Freq: Every day | ORAL | 0 refills | Status: DC

## 2020-05-19 MED ORDER — CETIRIZINE HCL 10 MG PO TABS: 10.0000 mg | ORAL_TABLET | Freq: Every day | ORAL | 0 refills | Status: DC

## 2020-05-19 MED ORDER — BENZONATATE 100 MG PO CAPS: 100.0000 mg | ORAL_CAPSULE | Freq: Three times a day (TID) | ORAL | 0 refills | Status: DC | PRN

## 2020-05-19 MED ORDER — ONDANSETRON 4 MG PO TBDP: 4.0000 mg | ORAL_TABLET | Freq: Three times a day (TID) | ORAL | 0 refills | Status: DC | PRN

## 2020-05-19 NOTE — ED Triage Notes (Signed)
States she has not ate in 2 days. States she vomited all night long last night and needs a work note.  States she has been having cold chills.

## 2020-05-19 NOTE — ED Provider Notes (Signed)
[facility]   [id number] 05/19/20 Arrival Time: 0321   CC: COVID symptoms  SUBJECTIVE: History from: patient.  Monique Long is a 20 y.o. female who presented to the urgent care with a complaint of nausea, vomiting, sore throat, cough, nasal congestion, and body aches for the past few days.  Denies sick exposure to COVID, flu or strep.  Denies recent travel.  Has tried OTC medication without relief.  Denies alleviating or aggravating factors.  Denies previous symptoms in the past.   Denies fever, chills, fatigue, sinus pain, rhinorrhea, sore throat, SOB, wheezing, chest pain, nausea, changes in bowel or bladder habits.     ROS: As per HPI.  All other pertinent ROS negative.     Past Medical History:  Diagnosis Date  . Hypothyroidism   . Thyroid disease    [employer] 02/29/2020   History reviewed. No pertinent surgical history. Allergies  Allergen Reactions  . Amoxicillin   . Penicillins Rash   No current facility-administered medications on file prior to encounter.   Current Outpatient Medications on File Prior to Encounter  Medication Sig Dispense Refill  . levothyroxine (SYNTHROID) 100 MCG tablet Take 1 tablet (100 mcg total) by mouth daily before breakfast. 90 tablet 1   Social History   Socioeconomic History  . Marital status: Single    Spouse name: Not on file  . Number of children: Not on file  . Years of education: Not on file  . Highest education level: Not on file  Occupational History  . Not on file  Tobacco Use  . Smoking status: Never Smoker  . Smokeless tobacco: Never Used  Vaping Use  . Vaping Use: Some days  Substance and Sexual Activity  . Alcohol use: No  . Drug use: Not Currently  . Sexual activity: Not on file  Other Topics Concern  . Not on file  Social History Narrative  . Not on file   Social Determinants of Health   Financial Resource Strain: Not on file  Food Insecurity: Not on file  Transportation Needs: Not on file   Physical Activity: Not on file  Stress: Not on file  Social Connections: Not on file  Intimate Partner Violence: Not on file   Family History  Problem Relation Age of Onset  . Hypertension Mother   . Heart failure Mother   . Cancer Father     OBJECTIVE:  Vitals:   05/19/20 1542  BP: 107/63  Pulse: (!) 105  Resp: 18  Temp: 99.8 F (37.7 C)  TempSrc: Oral  SpO2: 97%     General appearance: alert; appears fatigued, but nontoxic; speaking in full sentences and tolerating own secretions HEENT: NCAT; Ears: EACs clear, TMs pearly gray; Eyes: PERRL.  EOM grossly intact. Sinuses: nontender; Nose: nares patent without rhinorrhea, Throat: oropharynx clear, tonsils non erythematous or enlarged, uvula midline  Neck: supple without LAD Lungs: unlabored respirations, symmetrical air entry; cough: moderate; no respiratory distress; CTAB Heart: regular rate and rhythm.  Radial pulses 2+ symmetrical bilaterally Skin: warm and dry Psychological: alert and cooperative; normal mood and affect  LABS:  Results for orders placed or performed during the hospital encounter of 05/19/20 (from the past 24 hour(s))  POCT rapid strep A     Status: None   Collection Time: 05/19/20  3:54 PM  Result Value Ref Range   Rapid Strep A Screen Negative Negative     ASSESSMENT & PLAN:  1. Non-intractable vomiting with nausea, unspecified vomiting type  2. Sore throat   3. URI with cough and congestion   4. Body aches   5. Encounter for screening for COVID-19     Meds ordered this encounter  Medications  . benzonatate (TESSALON) 100 MG capsule    Sig: Take 1 capsule (100 mg total) by mouth 3 (three) times daily as needed for cough.    Dispense:  30 capsule    Refill:  0  . cetirizine (ZYRTEC ALLERGY) 10 MG tablet    Sig: Take 1 tablet (10 mg total) by mouth daily.    Dispense:  30 tablet    Refill:  0  . predniSONE (DELTASONE) 10 MG tablet    Sig: Take 2 tablets (20 mg total) by mouth daily.     Dispense:  15 tablet    Refill:  0  . ondansetron (ZOFRAN ODT) 4 MG disintegrating tablet    Sig: Take 1 tablet (4 mg total) by mouth every 8 (eight) hours as needed for nausea or vomiting.    Dispense:  20 tablet    Refill:  0    Discharge instructions   Strep test is negative.  Sample will be sent for culture and someone will call if your result is abnormal.  COVID testing ordered.  It will take between 2-7 days for test results.  Someone will contact you regarding abnormal results.     Get plenty of rest and push fluids Tessalon Perles prescribed for cough Zyrtec for nasal congestion, runny nose, and/or sore throat Zofran prescribed for nausea Prednisone was prescribed Gargle with salty warm water Use OTC throat lozenges such as Halls, Vicks or Cepacol to sooth throat Use medications daily for symptom relief Use OTC medications like ibuprofen or tylenol as needed fever or pain Call or go to the ED if you have any new or worsening symptoms such as fever, worsening cough, shortness of breath, chest tightness, chest pain, turning blue, changes in mental status, etc...   Reviewed expectations re: course of current medical issues. Questions answered. Outlined signs and symptoms indicating need for more acute intervention. Patient verbalized understanding. After Visit Summary given.         Emerson Monte, [place] 05/19/20 1559

## 2020-05-19 NOTE — Discharge Instructions (Signed)
Strep test is negative.  Sample will be sent for culture and someone will call if your result is abnormal.  COVID testing ordered.  It will take between 2-7 days for test results.  Someone will contact you regarding abnormal results.     Get plenty of rest and push fluids Tessalon Perles prescribed for cough Zyrtec for nasal congestion, runny nose, and/or sore throat Zofran prescribed for nausea Prednisone was prescribed Gargle with salty warm water Use OTC throat lozenges such as Halls, Vicks or Cepacol to sooth throat Use medications daily for symptom relief Use OTC medications like ibuprofen or tylenol as needed fever or pain Call or go to the ED if you have any new or worsening symptoms such as fever, worsening cough, shortness of breath, chest tightness, chest pain, turning blue, changes in mental status, etc..Marland Kitchen

## 2020-05-20 ENCOUNTER — Telehealth: Payer: Self-pay | Admitting: Emergency Medicine

## 2020-05-20 LAB — COVID-19, FLU A+B NAA
Influenza A, NAA: NOT DETECTED
Influenza B, NAA: NOT DETECTED
SARS-CoV-2, NAA: NOT DETECTED

## 2020-05-20 NOTE — Telephone Encounter (Signed)
Pt needs to have strep culture and covid/flu swab recollected

## 2020-05-21 ENCOUNTER — Ambulatory Visit
Admission: EM | Admit: 2020-05-21 | Discharge: 2020-05-21 | Disposition: A | Discharge status: Home / Self Care | Payer: Medicaid Other | Attending: Emergency Medicine | Admitting: Emergency Medicine

## 2020-05-21 ENCOUNTER — Telehealth: Payer: Self-pay | Admitting: Emergency Medicine

## 2020-05-21 DIAGNOSIS — R070 Pain in throat: Secondary | ICD-10-CM | POA: Diagnosis not present

## 2020-05-21 LAB — COVID-19, FLU A+B NAA
Influenza A, NAA: NOT DETECTED
Influenza B, NAA: NOT DETECTED
SARS-CoV-2, NAA: NOT DETECTED

## 2020-05-21 NOTE — ED Triage Notes (Signed)
Arrived pt to place strep culture orders for lab

## 2020-05-23 ENCOUNTER — Telehealth: Payer: Self-pay | Admitting: "Endocrinology

## 2020-05-23 ENCOUNTER — Other Ambulatory Visit: Payer: Self-pay

## 2020-05-23 DIAGNOSIS — E038 Other specified hypothyroidism: Secondary | ICD-10-CM

## 2020-05-23 LAB — CULTURE, GROUP A STREP (THRC)

## 2020-05-23 MED ORDER — LEVOTHYROXINE SODIUM 100 MCG PO TABS: 100.0000 ug | ORAL_TABLET | Freq: Every day | ORAL | 0 refills | Status: DC

## 2020-05-23 NOTE — Telephone Encounter (Signed)
Pt is requesting a refill levothyroxine (SYNTHROID) 100 MCG tablet    [place], [place] - [address] [place] Phone:  [phone]  Fax:  [phone]       Pt states she has missed her appts because she just stated a new job and can not take off right now, but she will get them done as soon as she gets a day off during the week.

## 2020-05-23 NOTE — Telephone Encounter (Signed)
Sent in with 0 refills as patient needs appt

## 2020-06-22 ENCOUNTER — Encounter: Payer: Self-pay | Admitting: Emergency Medicine

## 2020-06-22 ENCOUNTER — Ambulatory Visit
Admission: EM | Admit: 2020-06-22 | Discharge: 2020-06-22 | Disposition: A | Discharge status: Home / Self Care | Payer: Medicaid Other | Attending: Internal Medicine | Admitting: Internal Medicine

## 2020-06-22 DIAGNOSIS — T85848A Pain due to other internal prosthetic devices, implants and grafts, initial encounter: Secondary | ICD-10-CM

## 2020-06-22 MED ORDER — TRAMADOL HCL 50 MG PO TABS: 50.0000 mg | ORAL_TABLET | Freq: Four times a day (QID) | ORAL | 0 refills | Status: DC | PRN

## 2020-06-22 MED ORDER — CLINDAMYCIN HCL 300 MG PO CAPS: 300.0000 mg | ORAL_CAPSULE | Freq: Three times a day (TID) | ORAL | 0 refills | Status: DC

## 2020-06-22 MED ORDER — KETOROLAC TROMETHAMINE 60 MG/2ML IM SOLN
60.0000 mg | Freq: Once | INTRAMUSCULAR | Status: AC
  Administered 2020-06-22: 60 mg via INTRAMUSCULAR

## 2020-06-22 NOTE — ED Triage Notes (Signed)
Dental pain to bottom LT side in the back

## 2020-06-22 NOTE — ED Provider Notes (Signed)
[facility]    CSN: [id number] Arrival date & time: 06/22/20  1333      History   Chief Complaint Chief Complaint  Patient presents with  . Dental Pain    HPI [place] is a 20 y.o. female sho presents L lower molar pain x 1 month. Cant get to the dentist til 5/10.Has not been able to eat due to getting pain within a few minutes. Had a virtual visit with her dentist and he recommended her to be seen today.  Had a fever of 101.2 yesterday   Past Medical History:  Diagnosis Date  . Hypothyroidism   . Thyroid disease    [employer]reesia 02/29/2020    Patient Active Problem List   Diagnosis Date Noted  . Encounter to establish care 02/23/2020  . Menstruation, irregular 02/23/2020  . Hypothyroidism due to Hashimoto's thyroiditis 01/10/2020  . Hypothyroidism 11/18/2019    History reviewed. No pertinent surgical history.  OB History   No obstetric history on file.      Home Medications    Prior to Admission medications   Medication Sig Start Date End Date Taking? Authorizing Provider  clindamycin (CLEOCIN) 300 MG capsule Take 1 capsule (300 mg total) by mouth 3 (three) times daily. 06/22/20  Yes Rodriguez-Southworth, Sunday Spillers, PA-C  traMADol (ULTRAM) 50 MG tablet Take 1 tablet (50 mg total) by mouth every 6 (six) hours as needed. 06/22/20  Yes Rodriguez-Southworth, Sunday Spillers, PA-C  benzonatate (TESSALON) 100 MG capsule Take 1 capsule (100 mg total) by mouth 3 (three) times daily as needed for cough. 05/19/20   Avegno, Darrelyn Hillock, FNP  cetirizine (ZYRTEC ALLERGY) 10 MG tablet Take 1 tablet (10 mg total) by mouth daily. 05/19/20   Avegno, Darrelyn Hillock, FNP  levothyroxine (SYNTHROID) 100 MCG tablet Take 1 tablet (100 mcg total) by mouth daily before breakfast. PATIENT NEEDS APPT BEFORE REFILLS CAN BE AUTHORIZED 05/23/20   Cassandria Anger, MD  ondansetron (ZOFRAN ODT) 4 MG disintegrating tablet Take 1 tablet (4 mg total) by mouth every 8 (eight) hours as needed for nausea  or vomiting. 05/19/20   Avegno, Darrelyn Hillock, FNP  predniSONE (DELTASONE) 10 MG tablet Take 2 tablets (20 mg total) by mouth daily. 05/19/20   AvegnoDarrelyn Hillock, FNP    Family History Family History  Problem Relation Age of Onset  . Hypertension Mother   . Heart failure Mother   . Cancer Father     Social History Social History   Tobacco Use  . Smoking status: Never Smoker  . Smokeless tobacco: Never Used  Vaping Use  . Vaping Use: Some days  Substance Use Topics  . Alcohol use: No  . Drug use: Not Currently     Allergies   Amoxicillin and Penicillins   Review of Systems Review of Systems  Constitutional: Positive for fever.  HENT: Positive for dental problem. Negative for facial swelling.   Hematological: Negative for adenopathy.     Physical Exam Triage Vital Signs ED Triage Vitals  Enc Vitals Group     BP 06/22/20 1343 109/66     Pulse Rate 06/22/20 1343 71     Resp 06/22/20 1343 17     Temp 06/22/20 1343 98.4 F (36.9 C)     Temp Source 06/22/20 1343 Oral     SpO2 06/22/20 1343 98 %     Weight --      Height --      Head Circumference --  Peak Flow --      Pain Score 06/22/20 1342 10     Pain Loc --      Pain Edu? --      Excl. in [place]? --    No data found.  Updated Vital Signs BP 109/66 (BP Location: Right Arm)   Pulse 71   Temp 98.4 F (36.9 C) (Oral)   Resp 17   SpO2 98%   Visual Acuity Right Eye Distance:   Left Eye Distance:   Bilateral Distance:    Right Eye Near:   Left Eye Near:    Bilateral Near:     Physical Exam Vitals and nursing note reviewed.  Constitutional:      General: She is in acute distress.     Appearance: She is normal weight.     Comments: Is in a lot of pain  HENT:     Head: Normocephalic.     Right Ear: External ear normal.     Left Ear: External ear normal.     Mouth/Throat:     Mouth: Mucous membranes are moist.     Pharynx: Oropharynx is clear.     Comments: L lower wisdom tooth is coming out,  but has gum over grown on half of it and is red and tender.  Eyes:     General: No scleral icterus.    Conjunctiva/sclera: Conjunctivae normal.  Pulmonary:     Effort: Pulmonary effort is normal.  Musculoskeletal:        General: Normal range of motion.     Cervical back: Neck supple.  Lymphadenopathy:     Cervical: No cervical adenopathy.  Skin:    General: Skin is warm and dry.  Neurological:     Mental Status: She is alert and oriented to person, place, and time.     Gait: Gait normal.  Psychiatric:        Mood and Affect: Mood normal.        Behavior: Behavior normal.        Thought Content: Thought content normal.        Judgment: Judgment normal.      UC Treatments / Results  Labs (all labs ordered are listed, but only abnormal results are displayed) Labs Reviewed - No data to display  EKG   Radiology No results found.  Procedures Procedures (including critical care time)  Medications Ordered in UC Medications  ketorolac (TORADOL) injection 60 mg (60 mg Intramuscular Given 06/22/20 1353)    Initial Impression / Assessment and Plan / UC Course  I have reviewed the triage vital signs and the nursing notes. Dental pain with possible infection.  She was given Toradol 60 mg IM and I sent Clindamycin to her pharmacy. I handed her rx for Tramadol for pain.  Final Clinical Impressions(s) / UC Diagnoses   Final diagnoses:  Dental implant pain, initial encounter     Discharge Instructions     Eat soft foods until the swelling and inflammation goes down.     ED Prescriptions    Medication Sig Dispense Auth. Provider   clindamycin (CLEOCIN) 300 MG capsule Take 1 capsule (300 mg total) by mouth 3 (three) times daily. 30 capsule Rodriguez-Southworth, Tatym Schermer, PA-C   traMADol (ULTRAM) 50 MG tablet Take 1 tablet (50 mg total) by mouth every 6 (six) hours as needed. 15 tablet Rodriguez-Southworth, Sunday Spillers, PA-C     I have reviewed the PDMP during this encounter.    Shelby Mattocks, PA-C 06/22/20 1357

## 2020-06-22 NOTE — Discharge Instructions (Signed)
Eat soft foods until the swelling and inflammation goes down.

## 2020-06-26 NOTE — Telephone Encounter (Signed)
Patient left a VM requesting a urgent call back from the office, called pt, no answer, no VM, if she calls back and is asking for a refill, she needs appt with labs

## 2020-06-27 ENCOUNTER — Ambulatory Visit
Admission: EM | Admit: 2020-06-27 | Discharge: 2020-06-27 | Disposition: A | Discharge status: Home / Self Care | Payer: Medicaid Other | Attending: Family Medicine | Admitting: Family Medicine

## 2020-06-27 ENCOUNTER — Encounter: Payer: Self-pay | Admitting: Emergency Medicine

## 2020-06-27 DIAGNOSIS — A084 Viral intestinal infection, unspecified: Secondary | ICD-10-CM

## 2020-06-27 DIAGNOSIS — R112 Nausea with vomiting, unspecified: Secondary | ICD-10-CM

## 2020-06-27 DIAGNOSIS — Z3202 Encounter for pregnancy test, result negative: Secondary | ICD-10-CM

## 2020-06-27 DIAGNOSIS — R1084 Generalized abdominal pain: Secondary | ICD-10-CM | POA: Diagnosis not present

## 2020-06-27 LAB — POCT URINALYSIS DIP (MANUAL ENTRY)
Bilirubin, UA: NEGATIVE
Glucose, UA: NEGATIVE mg/dL
Leukocytes, UA: NEGATIVE
Nitrite, UA: NEGATIVE
Protein Ur, POC: NEGATIVE mg/dL
Spec Grav, UA: 1.025 (ref 1.010–1.025)
Urobilinogen, UA: 0.2 E.U./dL
pH, UA: 6 (ref 5.0–8.0)

## 2020-06-27 LAB — POCT URINE PREGNANCY: Preg Test, Ur: NEGATIVE

## 2020-06-27 MED ORDER — ONDANSETRON HCL 4 MG PO TABS: 4.0000 mg | ORAL_TABLET | Freq: Four times a day (QID) | ORAL | 0 refills | Status: DC

## 2020-06-27 NOTE — ED Triage Notes (Addendum)
Vomiting that started this morning.  Pt reports she is late on her period and is having lower abd pain.  Denies any burning with urination.

## 2020-06-27 NOTE — ED Provider Notes (Signed)
[place]   [id number] 06/27/20 Arrival Time: 1623  CC: ABDOMINAL PAIN  SUBJECTIVE:  Monique Long is a 20 y.o. female who presents with low abdominal pain, nausea, vomiting, and dyspareunia today. Denies a precipitating event, trauma, close contacts with similar symptoms, recent travel or antibiotic use. Denies abdominal cramping. Has not taken OTC medications for this. Denies alleviating or aggravating factors. Denies similar symptoms in the past.    Denies fever, chills, weight changes, chest pain, SOB, diarrhea, constipation, hematochezia, melena, dysuria, difficulty urinating, increased frequency or urgency, flank pain, loss of bowel or bladder function, vaginal discharge, vaginal odor, vaginal bleeding, pelvic pain.     No LMP recorded.  ROS: As per HPI.  All other pertinent ROS negative.     Past Medical History:  Diagnosis Date  . Hypothyroidism   . Thyroid disease    [employer]reesia 02/29/2020   History reviewed. No pertinent surgical history. Allergies  Allergen Reactions  . Amoxicillin   . Penicillins Rash   No current facility-administered medications on file prior to encounter.   Current Outpatient Medications on File Prior to Encounter  Medication Sig Dispense Refill  . benzonatate (TESSALON) 100 MG capsule Take 1 capsule (100 mg total) by mouth 3 (three) times daily as needed for cough. 30 capsule 0  . cetirizine (ZYRTEC ALLERGY) 10 MG tablet Take 1 tablet (10 mg total) by mouth daily. 30 tablet 0  . clindamycin (CLEOCIN) 300 MG capsule Take 1 capsule (300 mg total) by mouth 3 (three) times daily. 30 capsule 0  . levothyroxine (SYNTHROID) 100 MCG tablet Take 1 tablet (100 mcg total) by mouth daily before breakfast. PATIENT NEEDS APPT BEFORE REFILLS CAN BE AUTHORIZED 30 tablet 0  . predniSONE (DELTASONE) 10 MG tablet Take 2 tablets (20 mg total) by mouth daily. 15 tablet 0  . traMADol (ULTRAM) 50 MG tablet Take 1 tablet (50 mg total) by mouth every 6 (six)  hours as needed. 15 tablet 0   Social History   Socioeconomic History  . Marital status: Single    Spouse name: Not on file  . Number of children: Not on file  . Years of education: Not on file  . Highest education level: Not on file  Occupational History  . Not on file  Tobacco Use  . Smoking status: Never Smoker  . Smokeless tobacco: Never Used  Vaping Use  . Vaping Use: Some days  Substance and Sexual Activity  . Alcohol use: No  . Drug use: Not Currently  . Sexual activity: Not on file  Other Topics Concern  . Not on file  Social History Narrative  . Not on file   Social Determinants of Health   Financial Resource Strain: Not on file  Food Insecurity: Not on file  Transportation Needs: Not on file  Physical Activity: Not on file  Stress: Not on file  Social Connections: Not on file  Intimate Partner Violence: Not on file   Family History  Problem Relation Age of Onset  . Hypertension Mother   . Heart failure Mother   . Cancer Father      OBJECTIVE:  Vitals:   06/27/20 1636  BP: 117/70  Pulse: 88  Resp: 17  Temp: 98.4 F (36.9 C)  TempSrc: Oral  SpO2: 96%    General appearance: Alert; NAD HEENT: NCAT.  Oropharynx clear.  Lungs: clear to auscultation bilaterally without adventitious breath sounds Heart: regular rate and rhythm. Radial pulses 2+ symmetrical bilaterally Abdomen: soft, non-distended; normal  active bowel sounds; suprapubic tenderness to light palpation; nontender at McBurney's point; negative Murphy's sign; negative rebound; no guarding Back: no CVA tenderness Extremities: no edema; symmetrical with no gross deformities Skin: warm and dry Neurologic: normal gait Psychological: alert and cooperative; normal mood and affect  LABS: Results for orders placed or performed during the hospital encounter of 06/27/20 (from the past 24 hour(s))  POCT urine pregnancy     Status: None   Collection Time: 06/27/20  4:51 PM  Result Value Ref  Range   Preg Test, Ur Negative Negative  POCT urinalysis dipstick     Status: Abnormal   Collection Time: 06/27/20  4:51 PM  Result Value Ref Range   Color, UA yellow yellow   Clarity, UA hazy (A) clear   Glucose, UA negative negative mg/dL   Bilirubin, UA negative negative   Ketones, POC UA moderate (40) (A) negative mg/dL   Spec Grav, UA 1.025 1.010 - 1.025   Blood, UA trace-intact (A) negative   pH, UA 6.0 5.0 - 8.0   Protein Ur, POC negative negative mg/dL   Urobilinogen, UA 0.2 0.2 or 1.0 E.U./dL   Nitrite, UA Negative Negative   Leukocytes, UA Negative Negative    DIAGNOSTIC STUDIES: No results found.   ASSESSMENT & PLAN:  1. Viral gastroenteritis   2. Nausea and vomiting, intractability of vomiting not specified, unspecified vomiting type   3. Generalized abdominal pain   4. Negative pregnancy test     Meds ordered this encounter  Medications  . ondansetron (ZOFRAN) 4 MG tablet    Sig: Take 1 tablet (4 mg total) by mouth every 6 (six) hours.    Dispense:  12 tablet    Refill:  0    Order Specific Question:   Supervising Provider    Answer:   Chase Picket [id number]    UA unremarkable Negative pregnancy test Get rest and drink plenty of fluids Zofran prescribed.  Take as directed.    DIET Instructions:  30 minutes after taking nausea medicine, begin with sips of clear liquids. If able to hold down 2 - 4 ounces for 30 minutes, begin drinking more. Increase your fluid intake to replace losses. Clear liquids only for 24 hours (water, tea, sport drinks, clear flat ginger ale or cola and juices, broth, jello, popsicles, ect). Advance to bland foods, applesauce, rice, baked or boiled chicken, ect. Avoid milk, greasy foods and anything that doesn't agree with you.  If you experience new or worsening symptoms return or go to ER such as fever, chills, nausea, vomiting, diarrhea, bloody or dark tarry stools, constipation, urinary symptoms, worsening abdominal  discomfort, symptoms that do not improve with medications, inability to keep fluids down.  Reviewed expectations re: course of current medical issues. Questions answered. Outlined signs and symptoms indicating need for more acute intervention. Patient verbalized understanding. After Visit Summary given.   Faustino Congress, NP 06/27/20 1701

## 2020-06-27 NOTE — Discharge Instructions (Addendum)
Urine is not concerning for infection today  Negative pregnancy test  I have sent in Zofran for you to take one tablet every 8 hours as needed for nausea.  Follow up with this office or with primary care if symptoms are persisting.  Follow up in the ER for high fever, trouble swallowing, trouble breathing, other concerning symptoms.

## 2020-06-29 ENCOUNTER — Other Ambulatory Visit: Payer: Self-pay

## 2020-06-29 ENCOUNTER — Ambulatory Visit (HOSPITAL_COMMUNITY)
Admission: RE | Admit: 2020-06-29 | Discharge: 2020-06-29 | Disposition: A | Discharge status: Home / Self Care | Payer: Medicaid Other | Source: Ambulatory Visit | Attending: Internal Medicine | Admitting: Internal Medicine

## 2020-06-29 DIAGNOSIS — E039 Hypothyroidism, unspecified: Secondary | ICD-10-CM | POA: Diagnosis present

## 2020-06-29 NOTE — Telephone Encounter (Signed)
Pt called in today requesting to be seen right away. Informed patient she still has not done her blood work and she is needing that done before her appt. Patient is aware to call us back after she gets her blood work done.

## 2020-06-30 LAB — T4, FREE: Free T4: 1.51 ng/dL (ref 0.82–1.77)

## 2020-06-30 LAB — TSH: TSH: 0.964 u[IU]/mL (ref 0.450–4.500)

## 2020-07-03 ENCOUNTER — Ambulatory Visit: Payer: Medicaid Other | Admitting: Internal Medicine

## 2020-07-05 ENCOUNTER — Ambulatory Visit: Payer: Medicaid Other | Admitting: "Endocrinology

## 2020-07-07 ENCOUNTER — Other Ambulatory Visit: Payer: Self-pay | Admitting: *Deleted

## 2020-07-07 DIAGNOSIS — Z118 Encounter for screening for other infectious and parasitic diseases: Secondary | ICD-10-CM

## 2020-07-10 LAB — CHLAMYDIA/GC NAA, CONFIRMATION
Chlamydia trachomatis, NAA: NEGATIVE
Neisseria gonorrhoeae, NAA: NEGATIVE

## 2020-07-11 ENCOUNTER — Other Ambulatory Visit: Payer: Self-pay

## 2020-07-11 ENCOUNTER — Encounter: Payer: Self-pay | Admitting: "Endocrinology

## 2020-07-11 ENCOUNTER — Ambulatory Visit (INDEPENDENT_AMBULATORY_CARE_PROVIDER_SITE_OTHER): Payer: Medicaid Other | Admitting: "Endocrinology

## 2020-07-11 VITALS — BP 86/47 | HR 68 | Ht 65.0 in | Wt 120.8 lb

## 2020-07-11 DIAGNOSIS — E042 Nontoxic multinodular goiter: Secondary | ICD-10-CM | POA: Diagnosis not present

## 2020-07-11 DIAGNOSIS — I959 Hypotension, unspecified: Secondary | ICD-10-CM | POA: Diagnosis not present

## 2020-07-11 DIAGNOSIS — E063 Autoimmune thyroiditis: Secondary | ICD-10-CM | POA: Diagnosis not present

## 2020-07-11 DIAGNOSIS — E038 Other specified hypothyroidism: Secondary | ICD-10-CM

## 2020-07-11 MED ORDER — LEVOTHYROXINE SODIUM 100 MCG PO TABS: 100.0000 ug | ORAL_TABLET | Freq: Every day | ORAL | 1 refills | Status: DC

## 2020-07-11 NOTE — Progress Notes (Signed)
Pt experiencing episodes of dizziness x 1 week, last night worsening.

## 2020-07-11 NOTE — Progress Notes (Signed)
07/11/2020, 1:00 PM      Endocrinology follow-up note   Monique Long is a 20 y.o.-year-old female patient being seen in follow-up for hypothyroidism due to Hashimoto's thyroiditis.     Past Medical History:  Diagnosis Date  . Hypothyroidism   . Thyroid disease    [employer]reesia 02/29/2020    History reviewed. No pertinent surgical history.  Social History   Socioeconomic History  . Marital status: Single    Spouse name: Not on file  . Number of children: Not on file  . Years of education: Not on file  . Highest education level: Not on file  Occupational History  . Not on file  Tobacco Use  . Smoking status: Never Smoker  . Smokeless tobacco: Never Used  Vaping Use  . Vaping Use: Some days  Substance and Sexual Activity  . Alcohol use: No  . Drug use: Not Currently  . Sexual activity: Not on file  Other Topics Concern  . Not on file  Social History Narrative  . Not on file   Social Determinants of Health   Financial Resource Strain: Not on file  Food Insecurity: Not on file  Transportation Needs: Not on file  Physical Activity: Not on file  Stress: Not on file  Social Connections: Not on file    Family History  Problem Relation Age of Onset  . Hypertension Mother   . Heart failure Mother   . Cancer Father     Outpatient Encounter Medications as of 07/11/2020  Medication Sig  . benzonatate (TESSALON) 100 MG capsule Take 1 capsule (100 mg total) by mouth 3 (three) times daily as needed for cough. (Patient not taking: Reported on 07/11/2020)  . cetirizine (ZYRTEC ALLERGY) 10 MG tablet Take 1 tablet (10 mg total) by mouth daily. (Patient not taking: Reported on 07/11/2020)  . clindamycin (CLEOCIN) 300 MG capsule Take 1 capsule (300 mg total) by mouth 3 (three) times daily.  Marland Kitchen levothyroxine (SYNTHROID) 100 MCG tablet Take 1 tablet (100 mcg total) by mouth  daily before breakfast.  . ondansetron (ZOFRAN) 4 MG tablet Take 1 tablet (4 mg total) by mouth every 6 (six) hours. (Patient not taking: Reported on 07/11/2020)  . traMADol (ULTRAM) 50 MG tablet Take 1 tablet (50 mg total) by mouth every 6 (six) hours as needed. (Patient not taking: Reported on 07/11/2020)  . [DISCONTINUED] levothyroxine (SYNTHROID) 100 MCG tablet Take 1 tablet (100 mcg total) by mouth daily before breakfast. PATIENT NEEDS APPT BEFORE REFILLS CAN BE AUTHORIZED  . [DISCONTINUED] predniSONE (DELTASONE) 10 MG tablet Take 2 tablets (20 mg total) by mouth daily. (Patient not taking: Reported on 07/11/2020)   No facility-administered encounter medications on file as of 07/11/2020.    ALLERGIES: Allergies  Allergen Reactions  . Amoxicillin   . Penicillins Rash   VACCINATION  STATUS: Immunization History  Administered Date(s) Administered  . Hepatitis B, ped/adol 2000/06/18     HPI    Monique Long  is a patient with the above medical history. she was diagnosed  with hypothyroidism in the summer 2021.  She was subsequently put on thyroid hormone replacement.  During her last visit, she was kept on levothyroxine 100 mcg p.o. daily.  However, she kept taking her previous dose of 125 mcg p.o. daily.   Her clinical presentation is consistent with over replacement.  Her previsit labs are consistent with high normal free T4. Patient still reports anxiety, nausea, unintended weight loss.  -Her previsit thyroid ultrasound confirms multinodular goiter with 0.8 cm nodule on the right lobe and 1.6 cm suspicious nodule in the left lobe. -She continues to have on and off neck pain on the right side. -Today in the clinic, she was found to have hypotension of 86/47.  She describes occasional dizziness and lightheadedness.  She denies bleeding.  She denies any prior adrenal dysfunction.  she reports family history of thyroid dysfunction in one of her aunts taking thyroid hormone replacement.  - No  family history of thyroid cancer.  No history of  radiation therapy to head or neck. No recent use of iodine supplements.   ROS: Limited as above.  Physical Exam: BP (!) 86/47 (BP Location: Right Arm, Patient Position: Sitting) Comment: BP 80/50 R arm standing  Pulse 68   Ht 5\' 5"  (1.651 m)   Wt 120 lb 12.8 oz (54.8 kg)   BMI 20.10 kg/m  Wt Readings from Last 3 Encounters:  07/11/20 120 lb 12.8 oz (54.8 kg)  02/23/20 124 lb 6.4 oz (56.4 kg)  11/24/19 129 lb 9.6 oz (58.8 kg) (53 %, Z= 0.07)*   * Growth percentiles are based on CDC (Girls, 2-20 Years) data.     ASSESSMENT: 1. Hypothyroidism due to Hashimoto's thyroiditis 2.  Multinodular goiter 3.  Hypotension  PLAN:  -Her previsit thyroid function tests and her clinical presentation are consistent with slight over replacement. I advised her to discontinue her levothyroxine 125 mcg and start with 100 mcg of levothyroxine p.o. daily before breakfast.    - We discussed about the correct intake of her thyroid hormone, on empty stomach at fasting, with water, separated by at least 30 minutes from breakfast and other medications,  and separated by more than 4 hours from calcium, iron, multivitamins, acid reflux medications (PPIs). -Patient is made aware of the fact that thyroid hormone replacement is needed for life, dose to be adjusted by periodic monitoring of thyroid function tests.  -Regarding her multinodular goiter, the left-sided 1.6 cm nodule will need fine-needle aspiration biopsy.  I discussed this procedure and ordered to be done at [facility] radiology.  She will be seen in 3 weeks with results.  -Regarding her hypotension: No etiology yet.  She seems to have poor oral intake.  She denies craving for salt.  She is not on any antihypertensive medications.  She is encouraged to introduce more electrolyte liquids.  She will need further work-up with CMP, a.m. cortisol to evaluate adrenal sufficiency, CBC. -She may  need an EKG during her next encounter with her PCP.  She is advised to maintain close follow-up with her PCP.   I spent 30 minutes in the care of the patient today including review of labs from Thyroid Function, CMP, and other relevant labs ; imaging/biopsy records (current and previous including abstractions from other facilities); face-to-face  time discussing  her lab results and symptoms, medications doses, her options of short and long term treatment based on the latest standards of care / guidelines;   and documenting the encounter.  Sri L Restivo  participated in the discussions, expressed understanding, and voiced agreement with the above plans.  All questions were answered to her satisfaction. she is encouraged to contact clinic should she have any questions or concerns prior to her return visit.    Return in about 3 weeks (around 08/01/2020) for F/U with Pre-visit Labs, F/U with Biopsy Results.  Glade Lloyd, MD [facility] [address] [place], [place] [address] Phone: [phone]  Fax: [phone]   07/11/2020, 1:00 PM  This note was partially dictated with voice recognition software. Similar sounding words can be transcribed inadequately or may not  be corrected upon review.

## 2020-07-12 ENCOUNTER — Other Ambulatory Visit: Payer: Self-pay

## 2020-07-12 ENCOUNTER — Ambulatory Visit (INDEPENDENT_AMBULATORY_CARE_PROVIDER_SITE_OTHER): Payer: Medicaid Other | Admitting: Internal Medicine

## 2020-07-12 ENCOUNTER — Encounter: Payer: Self-pay | Admitting: Internal Medicine

## 2020-07-12 ENCOUNTER — Telehealth (HOSPITAL_COMMUNITY): Payer: Self-pay

## 2020-07-12 VITALS — BP 98/62 | HR 71 | Resp 18 | Ht 67.0 in | Wt 120.1 lb

## 2020-07-12 DIAGNOSIS — R112 Nausea with vomiting, unspecified: Secondary | ICD-10-CM | POA: Diagnosis not present

## 2020-07-12 DIAGNOSIS — E038 Other specified hypothyroidism: Secondary | ICD-10-CM | POA: Diagnosis not present

## 2020-07-12 DIAGNOSIS — N926 Irregular menstruation, unspecified: Secondary | ICD-10-CM

## 2020-07-12 DIAGNOSIS — I959 Hypotension, unspecified: Secondary | ICD-10-CM

## 2020-07-12 DIAGNOSIS — E042 Nontoxic multinodular goiter: Secondary | ICD-10-CM

## 2020-07-12 DIAGNOSIS — E063 Autoimmune thyroiditis: Secondary | ICD-10-CM

## 2020-07-12 MED ORDER — ONDANSETRON HCL 4 MG PO TABS: 4.0000 mg | ORAL_TABLET | Freq: Three times a day (TID) | ORAL | 0 refills | Status: DC | PRN

## 2020-07-12 NOTE — Progress Notes (Addendum)
Established Patient Office Visit  Subjective:  Patient ID: Monique Long, female    DOB: 2000-10-04  Age: 20 y.o. MRN: [id number]  CC:  Chief Complaint  Patient presents with  . Follow-up    Follow up pt bp has been running low     HPI Monique Long is a 20 year old female with PMH of hypothyroidism (Hashimoto thyroiditis) and irregular menstrual cycles who presents for follow up of her chronic medical conditions.  Hypothyroidism and thyroid nodules: She has been taking Levothyroxine regularly, although she had to take her old dose of 125 mcg for few days as she had run out of her 100 mcg dose. She denies diarrhea, blurry vision. She continue to lose weight, although less recently - 4 lbs in last 5 months.  Dizziness: She has been having intermittent dizziness. She admits that she does not take breakfast and feels nauseous as she takes Levothyroxine in the AM. She denies any chest pain or pressure, but reports intermittent palpitations.  She still has irregular menstrual cycles. She is planning for pregnancy, but has not been able to conceive.  Past Medical History:  Diagnosis Date  . Hypothyroidism   . Thyroid disease    [employer]reesia 02/29/2020    History reviewed. No pertinent surgical history.  Family History  Problem Relation Age of Onset  . Hypertension Mother   . Heart failure Mother   . Cancer Father     Social History   Socioeconomic History  . Marital status: Single    Spouse name: Not on file  . Number of children: Not on file  . Years of education: Not on file  . Highest education level: Not on file  Occupational History  . Not on file  Tobacco Use  . Smoking status: Never Smoker  . Smokeless tobacco: Never Used  Vaping Use  . Vaping Use: Some days  Substance and Sexual Activity  . Alcohol use: No  . Drug use: Not Currently  . Sexual activity: Not on file  Other Topics Concern  . Not on file  Social History Narrative  . Not on file   Social  Determinants of Health   Financial Resource Strain: Not on file  Food Insecurity: Not on file  Transportation Needs: Not on file  Physical Activity: Not on file  Stress: Not on file  Social Connections: Not on file  Intimate Partner Violence: Not on file    Outpatient Medications Prior to Visit  Medication Sig Dispense Refill  . clindamycin (CLEOCIN) 300 MG capsule Take 1 capsule (300 mg total) by mouth 3 (three) times daily. 30 capsule 0  . levothyroxine (SYNTHROID) 100 MCG tablet Take 1 tablet (100 mcg total) by mouth daily before breakfast. 90 tablet 1  . benzonatate (TESSALON) 100 MG capsule Take 1 capsule (100 mg total) by mouth 3 (three) times daily as needed for cough. (Patient not taking: No sig reported) 30 capsule 0  . cetirizine (ZYRTEC ALLERGY) 10 MG tablet Take 1 tablet (10 mg total) by mouth daily. (Patient not taking: No sig reported) 30 tablet 0  . ondansetron (ZOFRAN) 4 MG tablet Take 1 tablet (4 mg total) by mouth every 6 (six) hours. (Patient not taking: No sig reported) 12 tablet 0  . traMADol (ULTRAM) 50 MG tablet Take 1 tablet (50 mg total) by mouth every 6 (six) hours as needed. (Patient not taking: No sig reported) 15 tablet 0   No facility-administered medications prior to visit.    Allergies  Allergen Reactions  . Amoxicillin   . Penicillins Rash    ROS Review of Systems  Constitutional: Positive for fatigue and unexpected weight change. Negative for chills and fever.  HENT: Negative for congestion, sinus pressure, sinus pain and sore throat.   Eyes: Negative for pain and discharge.  Respiratory: Negative for cough and shortness of breath.   Cardiovascular: Positive for palpitations. Negative for chest pain.  Gastrointestinal: Negative for abdominal pain, constipation, diarrhea, nausea and vomiting.  Endocrine: Negative for polydipsia and polyuria.  Genitourinary: Positive for menstrual problem. Negative for dysuria, hematuria, pelvic pain and vaginal  discharge.  Musculoskeletal: Negative for neck pain and neck stiffness.  Skin: Negative for rash.  Neurological: Positive for dizziness. Negative for speech difficulty, weakness and headaches.  Psychiatric/Behavioral: Negative for agitation and behavioral problems.      Objective:    Physical Exam Vitals reviewed.  Constitutional:      General: She is not in acute distress.    Appearance: She is not diaphoretic.  HENT:     Head: Normocephalic and atraumatic.     Nose: Nose normal.     Mouth/Throat:     Mouth: Mucous membranes are moist.  Eyes:     General: No scleral icterus.    Extraocular Movements: Extraocular movements intact.  Neck:     Thyroid: Thyromegaly present. No thyroid tenderness.  Cardiovascular:     Rate and Rhythm: Normal rate and regular rhythm.     Pulses: Normal pulses.     Heart sounds: Normal heart sounds. No murmur heard.   Pulmonary:     Breath sounds: Normal breath sounds. No wheezing or rales.  Musculoskeletal:     Cervical back: Neck supple. No tenderness.     Right lower leg: No edema.     Left lower leg: No edema.  Skin:    General: Skin is warm.     Findings: No rash.  Neurological:     General: No focal deficit present.     Mental Status: She is alert and oriented to person, place, and time.     Sensory: No sensory deficit.     Motor: No weakness.  Psychiatric:        Mood and Affect: Mood normal.        Behavior: Behavior normal.     BP 98/62 (BP Location: Right Arm, Patient Position: Sitting, Cuff Size: Normal)   Pulse 71   Resp 18   Ht 5\' 7"  (1.702 m)   Wt 120 lb 1.9 oz (54.5 kg)   SpO2 99%   BMI 18.81 kg/m  Wt Readings from Last 3 Encounters:  07/12/20 120 lb 1.9 oz (54.5 kg)  07/11/20 120 lb 12.8 oz (54.8 kg)  02/23/20 124 lb 6.4 oz (56.4 kg)     Health Maintenance Due  Topic Date Due  . Hepatitis C Screening  Never done  . COVID-19 Vaccine (1) Never done  . HPV VACCINES (1 - 2-dose series) Never done  . HIV  Screening  Never done  . TETANUS/TDAP  Never done       Topic Date Due  . HPV VACCINES (1 - 2-dose series) Never done    Lab Results  Component Value Date   TSH 0.964 06/29/2020   Lab Results  Component Value Date   WBC 5.0 02/23/2020   HGB 12.3 02/23/2020   HCT 37.4 02/23/2020   MCV 85 02/23/2020   PLT 254 02/23/2020   Lab Results  Component Value Date  NA 139 02/23/2020   K 4.5 02/23/2020   CO2 21 02/23/2020   GLUCOSE 86 02/23/2020   BUN 13 02/23/2020   CREATININE 0.84 02/23/2020   BILITOT <0.2 02/23/2020   ALKPHOS 69 02/23/2020   AST 15 02/23/2020   ALT 16 02/23/2020   PROT 7.0 02/23/2020   ALBUMIN 4.7 02/23/2020   CALCIUM 9.4 02/23/2020   Lab Results  Component Value Date   CHOL 147 02/23/2020   Lab Results  Component Value Date   HDL 44 02/23/2020   Lab Results  Component Value Date   LDLCALC 90 02/23/2020   Lab Results  Component Value Date   TRIG 67 02/23/2020   Lab Results  Component Value Date   CHOLHDL 3.3 02/23/2020   Lab Results  Component Value Date   HGBA1C 5.6 02/23/2020      Assessment & Plan:   Problem List Items Addressed This Visit      Cardiovascular and Mediastinum   Hypotension - Primary    Episodes of dizziness EKG: Sinus arrhythmia. HR 64. No signs of acute ischemia. Most likely related to hydration status, advised to increase fluid intake Check AM cortisol        Endocrine   Hypothyroidism due to Hashimoto's thyroiditis    Lab Results  Component Value Date   TSH 0.964 06/29/2020   Thyroid biopsy reviewed On Levothyroxine 100 mcg QD Follow up with Dr Dorris Fetch      Multinodular goiter    Thyroid biopsy showed thyroid nodules Planned to get biopsy of the nodule Continue to follow up with Dr Dorris Fetch        Other   Menstruation, irregular    Thyroid function tests within normal limits now Follows up with Ob/Gyn - advised to discuss about irregular menstrual cycles       Other Visit Diagnoses    Nausea  and vomiting, intractability of vomiting not specified, unspecified vomiting type       Relevant Medications   ondansetron (ZOFRAN) 4 MG tablet      Meds ordered this encounter  Medications  . ondansetron (ZOFRAN) 4 MG tablet    Sig: Take 1 tablet (4 mg total) by mouth every 8 (eight) hours as needed for nausea or vomiting.    Dispense:  20 tablet    Refill:  0    Follow-up: Return in about 6 months (around 01/12/2021) for Weight check and hypothyroidism.    Lindell Spar, MD

## 2020-07-12 NOTE — Assessment & Plan Note (Signed)
Thyroid biopsy showed thyroid nodules Planned to get biopsy of the nodule Continue to follow up with Dr Dorris Fetch

## 2020-07-12 NOTE — Assessment & Plan Note (Signed)
Thyroid function tests within normal limits now Follows up with Ob/Gyn - advised to discuss about irregular menstrual cycles

## 2020-07-12 NOTE — Assessment & Plan Note (Addendum)
Episodes of dizziness EKG: Sinus arrhythmia. HR 64. No signs of acute ischemia. Most likely related to hydration status, advised to increase fluid intake Check AM cortisol

## 2020-07-12 NOTE — Patient Instructions (Signed)
Please continue taking medications as prescribed.  Please take at least 64 ounces of fluid in a day.  Please get fasting blood tests done and thyroid biopsy done as scheduled.

## 2020-07-12 NOTE — Assessment & Plan Note (Signed)
Lab Results  Component Value Date   TSH 0.964 06/29/2020   Thyroid biopsy reviewed On Levothyroxine 100 mcg QD Follow up with Dr Dorris Fetch

## 2020-07-20 ENCOUNTER — Ambulatory Visit (HOSPITAL_COMMUNITY)
Admission: RE | Admit: 2020-07-20 | Discharge: 2020-07-20 | Disposition: A | Discharge status: Home / Self Care | Payer: Medicaid Other | Source: Ambulatory Visit | Attending: "Endocrinology | Admitting: "Endocrinology

## 2020-07-20 ENCOUNTER — Other Ambulatory Visit: Payer: Self-pay | Admitting: Radiology

## 2020-07-20 ENCOUNTER — Other Ambulatory Visit: Payer: Self-pay

## 2020-07-20 ENCOUNTER — Encounter (HOSPITAL_COMMUNITY): Payer: Self-pay

## 2020-07-20 DIAGNOSIS — E041 Nontoxic single thyroid nodule: Secondary | ICD-10-CM | POA: Diagnosis not present

## 2020-07-20 DIAGNOSIS — E042 Nontoxic multinodular goiter: Secondary | ICD-10-CM

## 2020-07-20 MED ORDER — LIDOCAINE HCL (PF) 2 % IJ SOLN: 10.0000 mL | Freq: Once | INTRAMUSCULAR | Status: AC

## 2020-07-20 MED ORDER — LIDOCAINE HCL (PF) 2 % IJ SOLN
INTRAMUSCULAR | Status: AC
  Administered 2020-07-20: 10 mL
  Filled 2020-07-20: qty 10

## 2020-07-20 NOTE — Sedation Documentation (Signed)
PT tolerated thyroid biopsy procedure well today. Labs obtained and sent for pathology. PT ambulatory at discharge with no acute distress noted, given ice pack, work excuse note and verbalized understanding of discharge instructions.

## 2020-07-20 NOTE — Discharge Instructions (Signed)
Post Operative Instructions for Thyroid Biopsy  1. Keep pressure bandage over site of biopsy for 3-4 hours after leaving office.  2. Normal activity is permitted as long as it does not involve anything strenuous (i.e., jogging, heavy lifting, etc.).  3. Some minor pain may be experienced when the local anesthesia wears off, and should be relieved with Tylenol, Advil, or ibuprofen.  4. You may experience some bruising and/or swelling at the site of the biopsy.  5. If you should develop severe pain, difficulty swallowing or difficulty breathing, please call the office or [facility] at [phone].  6. Apply ice pack for 20 minutes this evening.      

## 2020-07-21 LAB — CYTOLOGY - NON PAP

## 2020-07-24 ENCOUNTER — Telehealth: Payer: Self-pay

## 2020-07-24 NOTE — Telephone Encounter (Signed)
Yes , we have to wait for her Afirma results , resch visit by 4 weeks.

## 2020-07-24 NOTE — Telephone Encounter (Signed)
Pt called to check to see if we had results of her biopsy. It looks like this will be sent to [facility] testing. Please advise and let pt know. We may need to move this follow up appt, it is scheduled for 5/25

## 2020-07-24 NOTE — Telephone Encounter (Signed)
Pt notified and appt changed

## 2020-08-02 ENCOUNTER — Ambulatory Visit: Payer: Medicaid Other | Admitting: "Endocrinology

## 2020-08-04 ENCOUNTER — Encounter (HOSPITAL_COMMUNITY): Payer: Self-pay

## 2020-08-11 ENCOUNTER — Other Ambulatory Visit: Payer: Self-pay

## 2020-08-11 ENCOUNTER — Ambulatory Visit (INDEPENDENT_AMBULATORY_CARE_PROVIDER_SITE_OTHER): Payer: Medicaid Other | Admitting: "Endocrinology

## 2020-08-11 ENCOUNTER — Encounter: Payer: Self-pay | Admitting: "Endocrinology

## 2020-08-11 VITALS — BP 90/46 | HR 64 | Ht 67.0 in | Wt 118.2 lb

## 2020-08-11 DIAGNOSIS — E063 Autoimmune thyroiditis: Secondary | ICD-10-CM

## 2020-08-11 DIAGNOSIS — C73 Malignant neoplasm of thyroid gland: Secondary | ICD-10-CM | POA: Diagnosis not present

## 2020-08-11 DIAGNOSIS — E038 Other specified hypothyroidism: Secondary | ICD-10-CM

## 2020-08-11 NOTE — Progress Notes (Signed)
08/11/2020, 11:47 AM      Endocrinology follow-up note   Monique Long is a 20 y.o.-year-old female patient being seen in follow-up for hypothyroidism due to Hashimoto's thyroiditis.  She was recently diagnosed with multinodular goiter.  She was sent for fine-needle biopsy of left-sided thyroid nodule.   Past Medical History:  Diagnosis Date  . Hypothyroidism   . Thyroid disease    Phreesia 02/29/2020    History reviewed. No pertinent surgical history.  Social History   Socioeconomic History  . Marital status: Single    Spouse name: Not on file  . Number of children: Not on file  . Years of education: Not on file  . Highest education level: Not on file  Occupational History  . Not on file  Tobacco Use  . Smoking status: Never Smoker  . Smokeless tobacco: Never Used  Vaping Use  . Vaping Use: Some days  Substance and Sexual Activity  . Alcohol use: No  . Drug use: Not Currently  . Sexual activity: Not on file  Other Topics Concern  . Not on file  Social History Narrative  . Not on file   Social Determinants of Health   Financial Resource Strain: Not on file  Food Insecurity: Not on file  Transportation Needs: Not on file  Physical Activity: Not on file  Stress: Not on file  Social Connections: Not on file    Family History  Problem Relation Age of Onset  . Hypertension Mother   . Heart failure Mother   . Cancer Father     Outpatient Encounter Medications as of 08/11/2020  Medication Sig  . levothyroxine (SYNTHROID) 100 MCG tablet Take 1 tablet (100 mcg total) by mouth daily before breakfast.  . ondansetron (ZOFRAN) 4 MG tablet Take 1 tablet (4 mg total) by mouth every 8 (eight) hours as needed for nausea or vomiting.  . [DISCONTINUED] clindamycin (CLEOCIN) 300 MG capsule Take 1 capsule (300 mg total) by mouth 3 (three) times daily.   No  facility-administered encounter medications on file as of 08/11/2020.    ALLERGIES: Allergies  Allergen Reactions  . Amoxicillin   . Penicillins Rash   VACCINATION STATUS: Immunization History  Administered Date(s) Administered  . Hepatitis B, ped/adol August 19, 1999     HPI    Monique Long  is a patient with the above medical history. she was diagnosed  with hypothyroidism in the summer 2021.  She was subsequently put on thyroid hormone replacement, currently on levothyroxine 100 mcg p.o. daily before breakfast.  Her fine-needle aspiration of 1.6 cm suspicious left lobe nodule was significant for atypia of undetermined significance.  A sample was sent for [facility] which confirms approximately 50% risk of malignancy.   -She continues to have on and off neck pain on the right side.  she reports  family history of thyroid dysfunction in one of her aunts taking thyroid hormone replacement.  - No family history of thyroid cancer, reports what appears to be head and neck cancer in her father which likely metastasized locally including to his thyroid,  who was a smoker. No history of  radiation therapy to head or neck. No recent use of iodine supplements.   ROS: Limited as above.  Physical Exam: BP (!) 90/46   Pulse 64   Ht 5\' 7"  (1.702 m)   Wt 118 lb 3.2 oz (53.6 kg)   BMI 18.51 kg/m  Wt Readings from Last 3 Encounters:  08/11/20 118 lb 3.2 oz (53.6 kg)  07/12/20 120 lb 1.9 oz (54.5 kg)  07/11/20 120 lb 12.8 oz (54.8 kg)     ASSESSMENT: 1. Hypothyroidism due to Hashimoto's thyroiditis 2.  Thyroid malignancy   PLAN:  She was called prior to her scheduled visit due to the finding of abnormal biopsy results. -cytology of 1.6 cm suspicious left lobe nodule was significant for atypia of undetermined significance.  A sample which was sent to [facility] suggesting approximately 50% risk of malignancy: In light of her relative youth, bilateral multinodular goiter, the fact that she is  already on levothyroxine, family history of head and neck cancer, she would benefit from total thyroidectomy. I discussed this approach with her and she agrees.  I will send her to Dr. Armandina Gemma of [place] [facility].  Hypothyroidism: She is currently on levothyroxine 100 mcg p.o. daily before breakfast.   - We discussed about the correct intake of her thyroid hormone, on empty stomach at fasting, with water, separated by at least 30 minutes from breakfast and other medications,  and separated by more than 4 hours from calcium, iron, multivitamins, acid reflux medications (PPIs). -Patient is made aware of the fact that thyroid hormone replacement is needed for life, dose to be adjusted by periodic monitoring of thyroid function tests.  She is advised to maintain close follow-up with her PCP.  I spent 25 minutes in the care of the patient today including review of labs from Thyroid Function, CMP, and other relevant labs ; imaging/biopsy records (current and previous including abstractions from other facilities); face-to-face time discussing  her lab results and symptoms, medications doses, her options of short and long term treatment based on the latest standards of care / guidelines;   and documenting the encounter.  Monique Long  participated in the discussions, expressed understanding, and voiced agreement with the above plans.  All questions were answered to her satisfaction. she is encouraged to contact clinic should she have any questions or concerns prior to her return visit.   Return in about 4 weeks (around 09/08/2020) for F/U with Labs after Surgery.  Glade Lloyd, MD [facility] [address] [place], [place] [address] Phone: [phone]  Fax: [phone]   08/11/2020, 11:47 AM  This note was partially dictated with voice recognition software. Similar sounding words can be transcribed  inadequately or may not  be corrected upon review.

## 2020-08-14 ENCOUNTER — Telehealth: Payer: Self-pay

## 2020-08-14 NOTE — Telephone Encounter (Signed)
Patient is calling to check the status of her referral to Dr Harlow Asa. She said that she called over there and they had not received the referral

## 2020-08-14 NOTE — Telephone Encounter (Signed)
Tried to call pt, did not receive an answer. 

## 2020-08-15 NOTE — Telephone Encounter (Signed)
Called pt, she stated she heard from Dr.Gerkin's office yesterday and has an upcoming appointment on Monday.

## 2020-08-15 NOTE — Telephone Encounter (Signed)
Patient called back and said she would like you to personally call her. Thank you

## 2020-08-15 NOTE — Telephone Encounter (Signed)
Pt called back requesting a call asap. Just documenting for our reference.

## 2020-08-15 NOTE — Telephone Encounter (Signed)
I spoke with her , advised her to see Dr. Posey Pronto for sore throat.

## 2020-08-17 ENCOUNTER — Ambulatory Visit: Payer: Self-pay | Admitting: Surgery

## 2020-08-21 ENCOUNTER — Ambulatory Visit: Payer: Medicaid Other | Admitting: "Endocrinology

## 2020-08-23 ENCOUNTER — Ambulatory Visit: Payer: Medicaid Other | Admitting: Internal Medicine

## 2020-09-06 ENCOUNTER — Other Ambulatory Visit: Payer: Self-pay

## 2020-09-06 ENCOUNTER — Encounter (HOSPITAL_COMMUNITY): Payer: Self-pay | Admitting: Emergency Medicine

## 2020-09-06 ENCOUNTER — Emergency Department (HOSPITAL_COMMUNITY)
Admission: EM | Admit: 2020-09-06 | Discharge: 2020-09-06 | Disposition: A | Discharge status: Home / Self Care | Payer: Medicaid Other | Attending: Emergency Medicine | Admitting: Emergency Medicine

## 2020-09-06 DIAGNOSIS — E039 Hypothyroidism, unspecified: Secondary | ICD-10-CM | POA: Insufficient documentation

## 2020-09-06 DIAGNOSIS — R070 Pain in throat: Secondary | ICD-10-CM | POA: Insufficient documentation

## 2020-09-06 DIAGNOSIS — R1310 Dysphagia, unspecified: Secondary | ICD-10-CM | POA: Diagnosis not present

## 2020-09-06 DIAGNOSIS — R112 Nausea with vomiting, unspecified: Secondary | ICD-10-CM

## 2020-09-06 DIAGNOSIS — Z85850 Personal history of malignant neoplasm of thyroid: Secondary | ICD-10-CM | POA: Diagnosis not present

## 2020-09-06 DIAGNOSIS — Z79899 Other long term (current) drug therapy: Secondary | ICD-10-CM | POA: Diagnosis not present

## 2020-09-06 LAB — URINALYSIS, ROUTINE W REFLEX MICROSCOPIC
Bilirubin Urine: NEGATIVE
Glucose, UA: NEGATIVE mg/dL
Hgb urine dipstick: NEGATIVE
Ketones, ur: NEGATIVE mg/dL
Nitrite: NEGATIVE
Protein, ur: NEGATIVE mg/dL
Specific Gravity, Urine: 1.018 (ref 1.005–1.030)
pH: 5 (ref 5.0–8.0)

## 2020-09-06 LAB — CBC WITH DIFFERENTIAL/PLATELET
Abs Immature Granulocytes: 0.01 10*3/uL (ref 0.00–0.07)
Basophils Absolute: 0 10*3/uL (ref 0.0–0.1)
Basophils Relative: 0 %
Eosinophils Absolute: 0.2 10*3/uL (ref 0.0–0.5)
Eosinophils Relative: 2 %
HCT: 42 % (ref 36.0–46.0)
Hemoglobin: 13.8 g/dL (ref 12.0–15.0)
Immature Granulocytes: 0 %
Lymphocytes Relative: 9 %
Lymphs Abs: 0.6 10*3/uL — ABNORMAL LOW (ref 0.7–4.0)
MCH: 29.9 pg (ref 26.0–34.0)
MCHC: 32.9 g/dL (ref 30.0–36.0)
MCV: 90.9 fL (ref 80.0–100.0)
Monocytes Absolute: 0.3 10*3/uL (ref 0.1–1.0)
Monocytes Relative: 5 %
Neutro Abs: 6 10*3/uL (ref 1.7–7.7)
Neutrophils Relative %: 84 %
Platelets: 248 10*3/uL (ref 150–400)
RBC: 4.62 MIL/uL (ref 3.87–5.11)
RDW: 12.9 % (ref 11.5–15.5)
WBC: 7.1 10*3/uL (ref 4.0–10.5)
nRBC: 0 % (ref 0.0–0.2)

## 2020-09-06 LAB — BASIC METABOLIC PANEL
Anion gap: 5 (ref 5–15)
BUN: 13 mg/dL (ref 6–20)
CO2: 26 mmol/L (ref 22–32)
Calcium: 8.8 mg/dL — ABNORMAL LOW (ref 8.9–10.3)
Chloride: 106 mmol/L (ref 98–111)
Creatinine, Ser: 0.71 mg/dL (ref 0.44–1.00)
GFR, Estimated: >60 mL/min (ref >60)
Glucose, Bld: 90 mg/dL (ref 70–99)
Potassium: 3.7 mmol/L (ref 3.5–5.1)
Sodium: 137 mmol/L (ref 135–145)

## 2020-09-06 LAB — PREGNANCY, URINE: Preg Test, Ur: NEGATIVE

## 2020-09-06 MED ORDER — SODIUM CHLORIDE 0.9 % IV BOLUS
1000.0000 mL | Freq: Once | INTRAVENOUS | Status: AC
  Administered 2020-09-06: 1000 mL via INTRAVENOUS

## 2020-09-06 MED ORDER — PANTOPRAZOLE SODIUM 20 MG PO TBEC: 20.0000 mg | DELAYED_RELEASE_TABLET | Freq: Every day | ORAL | 0 refills | Status: DC

## 2020-09-06 MED ORDER — ONDANSETRON HCL 4 MG/2ML IJ SOLN
4.0000 mg | Freq: Once | INTRAMUSCULAR | Status: AC
  Administered 2020-09-06: 4 mg via INTRAVENOUS
  Filled 2020-09-06: qty 2

## 2020-09-06 MED ORDER — ONDANSETRON HCL 4 MG PO TABS: 4.0000 mg | ORAL_TABLET | Freq: Four times a day (QID) | ORAL | 0 refills | Status: DC

## 2020-09-06 NOTE — ED Notes (Signed)
Pt. Tolerating fluids. 

## 2020-09-06 NOTE — ED Triage Notes (Signed)
Pt is having trouble with her thyroid swelling do to CA.  Pt states she felt her throat was closing this morning and tried to drink liquid to open it up only to begin vomiting.

## 2020-09-06 NOTE — ED Provider Notes (Signed)
[facility] EMERGENCY DEPARTMENT Provider Note   CSN: [id number] Arrival date & time: 09/06/20  3235     History No chief complaint on file.   Monique Long is a 20 y.o. female.  HPI      Monique Long is a 21 y.o. female with past medical history of hypothyroidism due to Hashimoto's thyroiditis recently diagnosed with multinodular goiter and underwent needle biopsy and findings that were concerning for malignancy.  She is here today complaining of nausea vomiting and difficulty swallowing this morning.  She states that she has recently been diagnosed with thyroid cancer and is scheduled to have a thyroidectomy July 7.  This morning, she woke with irritation of her throat.  She attempted to drink water and soda.  She states she is able to swallow fluids, but feels a burning sensation in her throat as she swallows.  She also reports 5-6 episodes of vomiting shortly after waking. She was concerned that her throat may be closing.  She contacted her doctor's office this morning and was advised to come to the emergency department for further evaluation.  She denies fever, chills, abdominal pain, chest pain, shortness of breath, recent illness, new medications or chemotherapy or radiation.   Past Medical History:  Diagnosis Date   Hypothyroidism    Thyroid disease    Phreesia 02/29/2020    Patient Active Problem List   Diagnosis Date Noted   Malignant neoplasm of thyroid gland ([place]) 08/11/2020   Multinodular goiter 07/11/2020   Hypotension 07/11/2020   Menstruation, irregular 02/23/2020   Hypothyroidism due to Hashimoto's thyroiditis 01/10/2020    History reviewed. No pertinent surgical history.   OB History   No obstetric history on file.     Family History  Problem Relation Age of Onset   Hypertension Mother    Heart failure Mother    Cancer Father     Social History   Tobacco Use   Smoking status: Never   Smokeless tobacco: Never  Vaping Use   Vaping Use: Some  days   Substances: Nicotine  Substance Use Topics   Alcohol use: No   Drug use: Not Currently    Home Medications Prior to Admission medications   Medication Sig Start Date End Date Taking? Authorizing Provider  levothyroxine (SYNTHROID) 100 MCG tablet Take 1 tablet (100 mcg total) by mouth daily before breakfast. 07/11/20  Yes Nida, Marella Chimes, MD  ondansetron (ZOFRAN) 4 MG tablet Take 1 tablet (4 mg total) by mouth every 8 (eight) hours as needed for nausea or vomiting. Patient not taking: Reported on 09/06/2020 07/12/20   Lindell Spar, MD    Allergies    Amoxicillin and Penicillins  Review of Systems   Review of Systems  Constitutional:  Negative for appetite change, chills, fatigue and fever.  HENT:  Positive for sore throat and trouble swallowing. Negative for congestion and voice change.   Respiratory:  Negative for cough, chest tightness and shortness of breath.   Cardiovascular:  Negative for chest pain.  Gastrointestinal:  Positive for nausea. Negative for abdominal pain, diarrhea and vomiting.  Musculoskeletal:  Negative for arthralgias, back pain and myalgias.  Skin:  Negative for rash.  Neurological:  Negative for dizziness, syncope, weakness and headaches.   Physical Exam Updated Vital Signs BP 106/75   Pulse 68   Temp 98.1 F (36.7 C)   Resp 16   Ht 5\' 6"  (1.676 m)   Wt 54.4 kg   LMP 09/03/2020 (Exact Date)  SpO2 100%   BMI 19.37 kg/m   Physical Exam Vitals and nursing note reviewed.  Constitutional:      General: She is not in acute distress.    Appearance: Normal appearance. She is not toxic-appearing.  HENT:     Head: Normocephalic.     Mouth/Throat:     Mouth: Mucous membranes are moist.     Pharynx: Oropharynx is clear. No oropharyngeal exudate or posterior oropharyngeal erythema.     Comments: Uvula midline, nonedematous.  No erythema or edema of the oropharynx or tonsils. Eyes:     Conjunctiva/sclera: Conjunctivae normal.     Pupils:  Pupils are equal, round, and reactive to light.  Neck:     Thyroid: No thyroid mass or thyromegaly.     Trachea: Trachea and phonation normal.  Cardiovascular:     Rate and Rhythm: Normal rate and regular rhythm.  Pulmonary:     Effort: Pulmonary effort is normal.  Abdominal:     General: There is no distension.     Palpations: Abdomen is soft.     Tenderness: There is no abdominal tenderness.  Musculoskeletal:        General: Normal range of motion.     Cervical back: Normal range of motion. No tenderness.     Right lower leg: No edema.     Left lower leg: No edema.  Lymphadenopathy:     Cervical: No cervical adenopathy.  Skin:    General: Skin is warm.     Findings: No rash.  Neurological:     General: No focal deficit present.     Mental Status: She is alert.     Sensory: No sensory deficit.     Motor: No weakness.    ED Results / Procedures / Treatments   Labs (all labs ordered are listed, but only abnormal results are displayed) Labs Reviewed  CBC WITH DIFFERENTIAL/PLATELET - Abnormal; Notable for the following components:      Result Value   Lymphs Abs 0.6 (*)    All other components within normal limits  URINALYSIS, ROUTINE W REFLEX MICROSCOPIC - Abnormal; Notable for the following components:   APPearance HAZY (*)    Leukocytes,Ua SMALL (*)    Bacteria, UA RARE (*)    All other components within normal limits  BASIC METABOLIC PANEL - Abnormal; Notable for the following components:   Calcium 8.8 (*)    All other components within normal limits  PREGNANCY, URINE    EKG None  Radiology No results found.  Procedures Procedures   Medications Ordered in ED Medications  sodium chloride 0.9 % bolus 1,000 mL (has no administration in time range)  ondansetron (ZOFRAN) injection 4 mg (has no administration in time range)    ED Course  I have reviewed the triage vital signs and the nursing notes.  Pertinent labs & imaging results that were available  during my care of the patient were reviewed by me and considered in my medical decision making (see chart for details).    MDM Rules/Calculators/A&P                          Patient here for evaluation of nausea, vomiting, and difficulty swallowing.  Symptoms began this morning after waking.  Has history of recently diagnosed thyroid cancer.  No prior chemo therapy or radiation or recent medication changes  On exam, patient well-appearing.  Vital signs reassuring.  Airway is patent.  She is speaking  clear and complete sentences without difficulty and handles her secretions without difficulty.  She describes a burning sensation as she swallows.  This could represent some reflux.  Labs interpreted by me, no leukocytosis.  No electrolyte derangement.  Urinalysis without evidence of infection and she is not currently pregnant.  We will give IV fluids, antiemetic and reassess.  After IV fluids and antiemetic.  Patient resting comfortably.  She states her symptoms have resolved.  She has tolerated oral fluids without difficulty.  No palpable thyromegaly or mass. No abdominal tenderness.  Doubt acute abdomen.  Feel that she is appropriate for discharge home, will prescribe PPI.  She is agreeable to close outpatient follow-up.     Final Clinical Impression(s) / ED Diagnoses Final diagnoses:  Non-intractable vomiting with nausea, unspecified vomiting type  Dysphagia, unspecified type    Rx / DC Orders ED Discharge Orders     None        Kem Parkinson, PA-C 09/06/20 1332    Wyvonnia Dusky, MD 09/06/20 1701

## 2020-09-06 NOTE — Discharge Instructions (Signed)
Frequent, small amounts of liquids today as tolerated.  You may start bland diet this afternoon.  Follow-up with your primary care provider for recheck.  Return emergency department for any new or worsening symptoms.

## 2020-09-28 NOTE — Patient Instructions (Addendum)
DUE TO COVID-19 ONLY ONE VISITOR IS ALLOWED TO COME WITH YOU AND STAY IN THE WAITING ROOM ONLY DURING PRE OP AND PROCEDURE DAY OF SURGERY. THE 1 VISITOR  MAY VISIT WITH YOU AFTER SURGERY IN YOUR PRIVATE ROOM DURING VISITING HOURS ONLY!  YOU NEED TO HAVE A COVID 19 TEST ON: 7/28 /22 @  2:45 PM.THIS TEST MUST BE DONE BEFORE SURGERY,  COVID TESTING SITE [address] [place] [address], IT IS ON THE RIGHT GOING OUT [address] APPROXIMATELY  2 MINUTES PAST [employer] ON THE RIGHT. ONCE YOUR COVID TEST IS COMPLETED,  PLEASE BEGIN THE QUARANTINE INSTRUCTIONS AS OUTLINED IN YOUR HANDOUT.               Monique Long   Your procedure is scheduled on: 10/09/20   Report to [facility] Main  Entrance   Report to short stay at : 5:15 AM     Call this number if you have problems the morning of surgery [phone]    Remember: Do not eat food or drink liquids :After Midnight.   BRUSH YOUR TEETH MORNING OF SURGERY AND RINSE YOUR MOUTH OUT, NO CHEWING GUM CANDY OR MINTS.    Take these medicines the morning of surgery with A SIP OF WATER: pantoprazole.                               You may not have any metal on your body including hair pins and              piercings  Do not wear jewelry, make-up, lotions, powders or perfumes, deodorant             Do not wear nail polish on your fingernails.  Do not shave  48 hours prior to surgery.    Do not bring valuables to the hospital. [place].  Contacts, dentures or bridgework may not be worn into surgery.  Leave suitcase in the car. After surgery it may be brought to your room.    Patients discharged the day of surgery will not be allowed to drive home. IF YOU ARE HAVING SURGERY AND GOING HOME THE SAME DAY, YOU MUST HAVE AN ADULT TO DRIVE YOU HOME AND BE WITH YOU FOR 24 HOURS. YOU MAY GO HOME BY TAXI OR UBER OR ORTHERWISE, BUT AN ADULT MUST ACCOMPANY YOU HOME AND STAY WITH YOU  FOR 24 HOURS.  Name and phone number of your driver:  Special Instructions: N/A              Please read over the following fact sheets you were given: _____________________________________________________________________           [facility] - Preparing for Surgery Before surgery, you can play an important role.  Because skin is not sterile, your skin needs to be as free of germs as possible.  You can reduce the number of germs on your skin by washing with CHG (chlorahexidine gluconate) soap before surgery.  CHG is an antiseptic cleaner which kills germs and bonds with the skin to continue killing germs even after washing. Please DO NOT use if you have an allergy to CHG or antibacterial soaps.  If your skin becomes reddened/irritated stop using the CHG and inform your nurse when you arrive at Short Stay. Do  not shave (including legs and underarms) for at least 48 hours prior to the first CHG shower.  You may shave your face/neck. Please follow these instructions carefully:  1.  Shower with CHG Soap the night before surgery and the  morning of Surgery.  2.  If you choose to wash your hair, wash your hair first as usual with your  normal  shampoo.  3.  After you shampoo, rinse your hair and body thoroughly to remove the  shampoo.                           4.  Use CHG as you would any other liquid soap.  You can apply chg directly  to the skin and wash                       Gently with a scrungie or clean washcloth.  5.  Apply the CHG Soap to your body ONLY FROM THE NECK DOWN.   Do not use on face/ open                           Wound or open sores. Avoid contact with eyes, ears mouth and genitals (private parts).                       Wash face,  Genitals (private parts) with your normal soap.             6.  Wash thoroughly, paying special attention to the area where your surgery  will be performed.  7.  Thoroughly rinse your body with warm water from the neck down.  8.  DO NOT shower/wash with  your normal soap after using and rinsing off  the CHG Soap.                9.  Pat yourself dry with a clean towel.            10.  Wear clean pajamas.            11.  Place clean sheets on your bed the night of your first shower and do not  sleep with pets. Day of Surgery : Do not apply any lotions/deodorants the morning of surgery.  Please wear clean clothes to the hospital/surgery center.  FAILURE TO FOLLOW THESE INSTRUCTIONS MAY RESULT IN THE CANCELLATION OF YOUR SURGERY PATIENT SIGNATURE_________________________________  NURSE SIGNATURE__________________________________  ________________________________________________________________________

## 2020-10-02 ENCOUNTER — Other Ambulatory Visit: Payer: Self-pay

## 2020-10-02 ENCOUNTER — Encounter (HOSPITAL_COMMUNITY): Payer: Self-pay

## 2020-10-02 ENCOUNTER — Encounter (HOSPITAL_COMMUNITY)
Admission: RE | Admit: 2020-10-02 | Discharge: 2020-10-02 | Disposition: A | Discharge status: Home / Self Care | Payer: Medicaid Other | Source: Ambulatory Visit | Attending: Surgery | Admitting: Surgery

## 2020-10-02 DIAGNOSIS — Z01818 Encounter for other preprocedural examination: Secondary | ICD-10-CM | POA: Insufficient documentation

## 2020-10-02 HISTORY — DX: Cardiac arrhythmia, unspecified: I49.9

## 2020-10-02 HISTORY — DX: Anxiety disorder, unspecified: F41.9

## 2020-10-02 HISTORY — DX: Malignant (primary) neoplasm, unspecified: C80.1

## 2020-10-02 HISTORY — DX: Depression, unspecified: F32.A

## 2020-10-02 HISTORY — DX: Unspecified asthma, uncomplicated: J45.909

## 2020-10-02 NOTE — Progress Notes (Signed)
COVID Vaccine Completed:NO Date COVID Vaccine completed: COVID vaccine manufacturer: Pfizer    [employer]   PCP - Dr. Ihor Dow. LOV: 07/12/20 Cardiologist -   Chest x-ray -  EKG - 07/12/20 Stress Test -  ECHO -  Cardiac Cath -  Pacemaker/ICD device last checked:  Sleep Study -  CPAP -   Fasting Blood Sugar -  Checks Blood Sugar _____ times a day  Blood Thinner Instructions: Aspirin Instructions: Last Dose:  Anesthesia review:   Patient denies shortness of breath, fever, cough and chest pain at PAT appointment   Patient verbalized understanding of instructions that were given to them at the PAT appointment. Patient was also instructed that they will need to review over the PAT instructions again at home before surgery.

## 2020-10-05 ENCOUNTER — Ambulatory Visit (HOSPITAL_COMMUNITY)
Admission: RE | Admit: 2020-10-05 | Discharge: 2020-10-05 | Disposition: A | Discharge status: Home / Self Care | Payer: Medicaid Other | Source: Ambulatory Visit | Attending: Anesthesiology | Admitting: Anesthesiology

## 2020-10-05 ENCOUNTER — Other Ambulatory Visit: Payer: Self-pay

## 2020-10-05 ENCOUNTER — Other Ambulatory Visit (HOSPITAL_COMMUNITY)
Admission: RE | Admit: 2020-10-05 | Discharge: 2020-10-05 | Disposition: A | Discharge status: Home / Self Care | Payer: Medicaid Other | Source: Ambulatory Visit | Attending: Surgery | Admitting: Surgery

## 2020-10-05 ENCOUNTER — Other Ambulatory Visit (HOSPITAL_COMMUNITY): Payer: Medicaid Other

## 2020-10-05 ENCOUNTER — Encounter (HOSPITAL_COMMUNITY)
Admission: RE | Admit: 2020-10-05 | Discharge: 2020-10-05 | Disposition: A | Discharge status: Home / Self Care | Payer: Medicaid Other | Source: Ambulatory Visit | Attending: Surgery | Admitting: Surgery

## 2020-10-05 DIAGNOSIS — E041 Nontoxic single thyroid nodule: Secondary | ICD-10-CM | POA: Insufficient documentation

## 2020-10-05 DIAGNOSIS — Z01812 Encounter for preprocedural laboratory examination: Secondary | ICD-10-CM | POA: Insufficient documentation

## 2020-10-05 DIAGNOSIS — Z20822 Contact with and (suspected) exposure to covid-19: Secondary | ICD-10-CM | POA: Insufficient documentation

## 2020-10-05 LAB — SARS CORONAVIRUS 2 (TAT 6-24 HRS): SARS Coronavirus 2: NEGATIVE

## 2020-10-05 LAB — CBC
HCT: 38.8 % (ref 36.0–46.0)
Hemoglobin: 12.5 g/dL (ref 12.0–15.0)
MCH: 29.1 pg (ref 26.0–34.0)
MCHC: 32.2 g/dL (ref 30.0–36.0)
MCV: 90.4 fL (ref 80.0–100.0)
Platelets: 210 10*3/uL (ref 150–400)
RBC: 4.29 MIL/uL (ref 3.87–5.11)
RDW: 12.8 % (ref 11.5–15.5)
WBC: 4.1 10*3/uL (ref 4.0–10.5)
nRBC: 0 % (ref 0.0–0.2)

## 2020-10-08 ENCOUNTER — Encounter (HOSPITAL_COMMUNITY): Payer: Self-pay | Admitting: Surgery

## 2020-10-08 DIAGNOSIS — D440 Neoplasm of uncertain behavior of thyroid gland: Secondary | ICD-10-CM | POA: Diagnosis present

## 2020-10-08 NOTE — Anesthesia Preprocedure Evaluation (Addendum)
Anesthesia Evaluation  Patient identified by MRN, date of birth, ID band Patient awake    Reviewed: Allergy & Precautions, NPO status , Patient's Chart, lab work & pertinent test results  Airway Mallampati: I  TM Distance: >3 FB Neck ROM: Full    Dental no notable dental hx. (+) Dental Advisory Given   Pulmonary asthma , Patient abstained from smoking.,    Pulmonary exam normal breath sounds clear to auscultation       Cardiovascular + dysrhythmias  Rhythm:Regular Rate:Bradycardia     Neuro/Psych PSYCHIATRIC DISORDERS Anxiety Depression negative neurological ROS     GI/Hepatic negative GI ROS, Neg liver ROS,   Endo/Other  Hypothyroidism   Renal/GU negative Renal ROS     Musculoskeletal negative musculoskeletal ROS (+)   Abdominal   Peds  Hematology negative hematology ROS (+)   Anesthesia Other Findings   Reproductive/Obstetrics negative OB ROS                            Anesthesia Physical Anesthesia Plan  ASA: 2  Anesthesia Plan: General   Post-op Pain Management:    Induction: Intravenous  PONV Risk Score and Plan: 4 or greater and Ondansetron, Dexamethasone, Midazolam, Treatment may vary due to age or medical condition, Diphenhydramine, Propofol infusion and TIVA  Airway Management Planned: Oral ETT  Additional Equipment: None  Intra-op Plan:   Post-operative Plan: Extubation in OR  Informed Consent: I have reviewed the patients History and Physical, chart, labs and discussed the procedure including the risks, benefits and alternatives for the proposed anesthesia with the patient or authorized representative who has indicated his/her understanding and acceptance.     Dental advisory given  Plan Discussed with: CRNA  Anesthesia Plan Comments:       Anesthesia Quick Evaluation

## 2020-10-08 NOTE — H&P (Signed)
General Surgery [facility]  Monique Long DOB: 17-Nov-2000 Single / Language: [place] / Race: White Female   History of Present Illness   The patient is a 20 year old female who presents with a thyroid nodule.  CHIEF COMPLAINT: thyroid neoplasm of uncertain behavior  Patient is referred by Dr. Glade Lloyd for surgical evaluation and management of a newly diagnosed thyroid neoplasm of uncertain behavior.  Patient has been under the care of her endocrinologist for approximately one year for hypothyroidism and Hashimoto's thyroiditis.  She is currently taking levothyroxine 100 g daily.  Her most recent TSH level was normal at 0.964.  Patient underwent an ultrasound examination on May 20, 2020.  This demonstrated bilateral thyroid nodules with an 8 mm nodule in the right thyroid lobe and a 1.6 cm nodule in the left thyroid lobe.  This was felt to be moderately suspicious and fine-needle aspiration biopsy was performed on Jul 20, 2020.  This demonstrated atypia of undetermined significance, Bethesda category III.  The specimen was sent for molecular genetic testing, AFIRMA, and returned with a result of suspicious, rendering a risk of malignancy of 50%.  Patient is therefore referred for consideration for resection for definitive diagnosis and management.  Patient has had no prior head or neck surgery.  There is no family history of thyroid cancer.  She is accompanied today by her mother.   Past Surgical History  No pertinent past surgical history    Diagnostic Studies History  Colonoscopy   never Mammogram   never Pap Smear   never  Allergies  Amoxicillin *PENICILLINS*   Penicillin G Pot in Dextrose *PENICILLINS*   Allergies Reconciled    Medication History  Levothyroxine Sodium  (125MCG Tablet, Oral) Active. Medications Reconciled   Social History  Alcohol use   Occasional alcohol use. Caffeine use   Carbonated beverages. No drug use   Tobacco use   Current  every day smoker.  Family History  Alcohol Abuse   Father. Anesthetic complications   Sister. Depression   Mother. Family history unknown   First Degree Relatives   Heart Disease   Mother, Sister. Heart disease in female family member before age 84   Hypertension   Mother.  Pregnancy / Birth History  Age at menarche   87 years. Contraceptive History   Contraceptive implant. Gravida   1 Irregular periods   Length (months) of breastfeeding   3-6 Maternal age   59-20 Para   1  Other Problems Depression   Thyroid Disease    Review of Systems  General Present- Appetite Loss and Weight Loss. Not Present- Chills, Fatigue, Fever, Night Sweats and Weight Gain. Skin Not Present- Change in Wart/Mole, Dryness, Hives, Jaundice, New Lesions, Non-Healing Wounds, Rash and Ulcer. HEENT Present- Hoarseness, Seasonal Allergies and Sore Throat. Not Present- Earache, Hearing Loss, Nose Bleed, Oral Ulcers, Ringing in the Ears, Sinus Pain, Visual Disturbances, Wears glasses/contact lenses and Yellow Eyes. Respiratory Present- Chronic Cough. Not Present- Bloody sputum, Difficulty Breathing, Snoring and Wheezing. Breast Not Present- Breast Mass, Breast Pain, Nipple Discharge and Skin Changes. Cardiovascular Not Present- Chest Pain, Difficulty Breathing Lying Down, Leg Cramps, Palpitations, Rapid Heart Rate, Shortness of Breath and Swelling of Extremities. Gastrointestinal Not Present- Abdominal Pain, Bloating, Bloody Stool, Change in Bowel Habits, Chronic diarrhea, Constipation, Difficulty Swallowing, Excessive gas, Gets full quickly at meals, Hemorrhoids, Indigestion, Nausea, Rectal Pain and Vomiting. Female Genitourinary Not Present- Frequency, Nocturia, Painful Urination, Pelvic Pain and Urgency. Musculoskeletal Not Present- Back  Pain, Joint Pain, Joint Stiffness, Muscle Pain, Muscle Weakness and Swelling of Extremities. Neurological Not Present- Decreased Memory, Fainting, Headaches, Numbness,  Seizures, Tingling, Tremor, Trouble walking and Weakness. Psychiatric Present- Anxiety, Bipolar, Change in Sleep Pattern, Depression and Frequent crying. Not Present- Fearful. Endocrine Present- Cold Intolerance. Not Present- Excessive Hunger, Hair Changes, Heat Intolerance, Hot flashes and New Diabetes. Hematology Present- Easy Bruising. Not Present- Blood Thinners, Excessive bleeding, Gland problems, HIV and Persistent Infections.  Vitals Weight: 121.38 lb   Height: 67 in  Body Surface Area: 1.64 m   Body Mass Index: 19.01 kg/m   Temp.: 98.7 F    Pulse: 105 (Regular)    P.OX: 98% (Room air) BP: 90/60(Sitting, Left Arm, Standard)  Physical Exam   GENERAL APPEARANCE Development: normal Nutritional status: normal Gross deformities: none  SKIN Rash, lesions, ulcers: none Induration, erythema: none Nodules: none palpable  EYES Conjunctiva and lids: normal Pupils: equal and reactive Iris: normal bilaterally  EARS, NOSE, MOUTH, THROAT External ears: no lesion or deformity External nose: no lesion or deformity Hearing: grossly normal Due to Covid-19 pandemic, patient is wearing a mask.  NECK Symmetric: yes Trachea: midline Thyroid: Right thyroid lobe is without palpable abnormality. Palpation of the left thyroid lobe shows a subtle smooth relatively soft nodule in the inferior pole which is mobile with swallowing and nontender. There is no associated lymphadenopathy.  CHEST Respiratory effort: normal Retraction or accessory muscle use: no Breath sounds: normal bilaterally Rales, rhonchi, wheeze: none  CARDIOVASCULAR Auscultation: regular rhythm, normal rate Murmurs: none Pulses: radial pulse 2+ palpable Lower extremity edema: none  ABDOMEN Distension: none Masses: none palpable Tenderness: none Hepatosplenomegaly: not present Hernia: not present  GENITOURINARY Penis: no lesions Scrotum: no masses  MUSCULOSKELETAL Station and gait: normal Digits and nails:  no clubbing or cyanosis Muscle strength: grossly normal all extremities Range of motion: grossly normal all extremities Deformity: none  LYMPHATIC Cervical: none palpable Supraclavicular: none palpable  PSYCHIATRIC Oriented to person, place, and time: yes Mood and affect: normal for situation Judgment and insight: appropriate for situation    Assessment & Plan   NEOPLASM OF UNCERTAIN BEHAVIOR OF THYROID GLAND (D44.0) MULTIPLE THYROID NODULES (E04.2) 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  She is accompanied by her mother.   Patient provided with a copy of "The Thyroid Book: Medical and Surgical Treatment of Thyroid Problems", published by [employer], 16 pages.  Book reviewed and explained to patient during visit today.  Patient has bilateral thyroid nodules.  A nodule on the left has cytologic changes with atypia and a molecular genetic pattern rendering a risk of malignancy of 50%.  Patient has Hashimoto's thyroiditis and hypothyroidism.  She is already on full replacement dose of thyroid hormone.  We discussed the options for management being either left thyroid lobectomy versus total thyroidectomy.  We discussed risk and benefits of each procedure.  We discussed the potential for recurrent laryngeal nerve injury or injury to parathyroid glands.  We discussed the size and location of the surgical incision.  We discussed the hospital stay to be anticipated.  The patient understands her options and wishes to proceed with total thyroidectomy in order to avoid additional surgery and additional testing in the future.  She is already on a replacement thyroid hormone dosage.  We will make plans to proceed with total thyroidectomy in the near future.  The risks and benefits of the procedure  have been discussed at length with the patient.  The patient  understands the proposed procedure, potential alternative treatments, and the course of recovery to be expected.  All of the patient's questions have been answered at this time.  The patient wishes to proceed with surgery.  Armandina Gemma, MD [facility] Office: [phone]

## 2020-10-09 ENCOUNTER — Encounter (HOSPITAL_COMMUNITY)
Admission: RE | Disposition: A | Discharge status: Home / Self Care | Payer: Self-pay | Source: Home / Self Care | Attending: Surgery

## 2020-10-09 ENCOUNTER — Ambulatory Visit (HOSPITAL_COMMUNITY): Payer: Medicaid Other | Admitting: Registered Nurse

## 2020-10-09 ENCOUNTER — Ambulatory Visit (HOSPITAL_COMMUNITY)
Admission: RE | Admit: 2020-10-09 | Discharge: 2020-10-10 | Disposition: A | Discharge status: Home / Self Care | Payer: Medicaid Other | Attending: Surgery | Admitting: Surgery

## 2020-10-09 ENCOUNTER — Encounter (HOSPITAL_COMMUNITY): Payer: Self-pay | Admitting: Surgery

## 2020-10-09 DIAGNOSIS — D34 Benign neoplasm of thyroid gland: Secondary | ICD-10-CM | POA: Diagnosis not present

## 2020-10-09 DIAGNOSIS — E042 Nontoxic multinodular goiter: Secondary | ICD-10-CM | POA: Diagnosis not present

## 2020-10-09 DIAGNOSIS — D440 Neoplasm of uncertain behavior of thyroid gland: Secondary | ICD-10-CM | POA: Diagnosis present

## 2020-10-09 DIAGNOSIS — Z79890 Hormone replacement therapy (postmenopausal): Secondary | ICD-10-CM | POA: Diagnosis not present

## 2020-10-09 DIAGNOSIS — E038 Other specified hypothyroidism: Secondary | ICD-10-CM | POA: Diagnosis present

## 2020-10-09 DIAGNOSIS — F17200 Nicotine dependence, unspecified, uncomplicated: Secondary | ICD-10-CM | POA: Diagnosis not present

## 2020-10-09 DIAGNOSIS — Z880 Allergy status to penicillin: Secondary | ICD-10-CM | POA: Insufficient documentation

## 2020-10-09 DIAGNOSIS — E063 Autoimmune thyroiditis: Secondary | ICD-10-CM | POA: Diagnosis not present

## 2020-10-09 HISTORY — PX: THYROIDECTOMY: SHX17

## 2020-10-09 LAB — PREGNANCY, URINE: Preg Test, Ur: NEGATIVE

## 2020-10-09 LAB — TYPE AND SCREEN
ABO/RH(D): A POS
Antibody Screen: NEGATIVE

## 2020-10-09 LAB — ABO/RH: ABO/RH(D): A POS

## 2020-10-09 SURGERY — THYROIDECTOMY
Anesthesia: General | Site: Neck | Laterality: Bilateral

## 2020-10-09 MED ORDER — 0.9 % SODIUM CHLORIDE (POUR BTL) OPTIME
TOPICAL | Status: DC | PRN
  Administered 2020-10-09: 1000 mL

## 2020-10-09 MED ORDER — HYDROMORPHONE HCL 1 MG/ML IJ SOLN
INTRAMUSCULAR | Status: AC
  Administered 2020-10-09: 0.5 mg via INTRAVENOUS
  Filled 2020-10-09: qty 1

## 2020-10-09 MED ORDER — PROPOFOL 10 MG/ML IV BOLUS
INTRAVENOUS | Status: AC
  Filled 2020-10-09: qty 20

## 2020-10-09 MED ORDER — PROPOFOL 500 MG/50ML IV EMUL
INTRAVENOUS | Status: AC
  Filled 2020-10-09: qty 50

## 2020-10-09 MED ORDER — DROPERIDOL 2.5 MG/ML IJ SOLN: 0.6250 mg | Freq: Once | INTRAMUSCULAR | Status: DC | PRN

## 2020-10-09 MED ORDER — MIDAZOLAM HCL 5 MG/5ML IJ SOLN
INTRAMUSCULAR | Status: DC | PRN
Start: 2020-10-09 — End: 2020-10-09
  Administered 2020-10-09: 2 mg via INTRAVENOUS

## 2020-10-09 MED ORDER — LIDOCAINE 2% (20 MG/ML) 5 ML SYRINGE
INTRAMUSCULAR | Status: AC
  Filled 2020-10-09: qty 5

## 2020-10-09 MED ORDER — ROCURONIUM BROMIDE 10 MG/ML (PF) SYRINGE
PREFILLED_SYRINGE | INTRAVENOUS | Status: AC
  Filled 2020-10-09: qty 10

## 2020-10-09 MED ORDER — CHLORHEXIDINE GLUCONATE CLOTH 2 % EX PADS: 6.0000 | MEDICATED_PAD | Freq: Once | CUTANEOUS | Status: DC

## 2020-10-09 MED ORDER — FENTANYL CITRATE (PF) 250 MCG/5ML IJ SOLN
INTRAMUSCULAR | Status: DC | PRN
  Administered 2020-10-09 (×5): 50 ug via INTRAVENOUS

## 2020-10-09 MED ORDER — LEVOTHYROXINE SODIUM 100 MCG PO TABS
100.0000 ug | ORAL_TABLET | Freq: Every day | ORAL | Status: DC
  Administered 2020-10-10: 100 ug via ORAL
  Filled 2020-10-09: qty 1

## 2020-10-09 MED ORDER — MEPERIDINE HCL 50 MG/ML IJ SOLN: 6.2500 mg | INTRAMUSCULAR | Status: DC | PRN

## 2020-10-09 MED ORDER — ONDANSETRON HCL 4 MG/2ML IJ SOLN
INTRAMUSCULAR | Status: AC
  Filled 2020-10-09: qty 2

## 2020-10-09 MED ORDER — DEXAMETHASONE SODIUM PHOSPHATE 10 MG/ML IJ SOLN
INTRAMUSCULAR | Status: DC | PRN
  Administered 2020-10-09: 10 mg via INTRAVENOUS

## 2020-10-09 MED ORDER — TRAMADOL HCL 50 MG PO TABS
50.0000 mg | ORAL_TABLET | Freq: Four times a day (QID) | ORAL | Status: DC | PRN
Start: 2020-10-09 — End: 2020-10-10
  Administered 2020-10-09: 50 mg via ORAL
  Filled 2020-10-09 (×2): qty 1

## 2020-10-09 MED ORDER — HEMOSTATIC AGENTS (NO CHARGE) OPTIME
TOPICAL | Status: DC | PRN
  Administered 2020-10-09: 1 via TOPICAL

## 2020-10-09 MED ORDER — PROPOFOL 500 MG/50ML IV EMUL
INTRAVENOUS | Status: DC | PRN
Start: 2020-10-09 — End: 2020-10-09
  Administered 2020-10-09: 100 ug/kg/min via INTRAVENOUS

## 2020-10-09 MED ORDER — ONDANSETRON HCL 4 MG/2ML IJ SOLN
INTRAMUSCULAR | Status: DC | PRN
  Administered 2020-10-09: 4 mg via INTRAVENOUS

## 2020-10-09 MED ORDER — OXYCODONE HCL 5 MG PO TABS
ORAL_TABLET | ORAL | Status: AC
  Filled 2020-10-09: qty 1

## 2020-10-09 MED ORDER — ROCURONIUM BROMIDE 10 MG/ML (PF) SYRINGE
PREFILLED_SYRINGE | INTRAVENOUS | Status: DC | PRN
  Administered 2020-10-09: 20 mg via INTRAVENOUS
  Administered 2020-10-09: 50 mg via INTRAVENOUS
  Administered 2020-10-09: 20 mg via INTRAVENOUS
  Administered 2020-10-09: 10 mg via INTRAVENOUS

## 2020-10-09 MED ORDER — DIPHENHYDRAMINE HCL 50 MG/ML IJ SOLN
INTRAMUSCULAR | Status: AC
  Filled 2020-10-09: qty 1

## 2020-10-09 MED ORDER — PROPOFOL 10 MG/ML IV BOLUS
INTRAVENOUS | Status: DC | PRN
  Administered 2020-10-09: 30 mg via INTRAVENOUS
  Administered 2020-10-09: 150 mg via INTRAVENOUS

## 2020-10-09 MED ORDER — ORAL CARE MOUTH RINSE: 15.0000 mL | Freq: Once | OROMUCOSAL | Status: AC

## 2020-10-09 MED ORDER — DIPHENHYDRAMINE HCL 50 MG/ML IJ SOLN
INTRAMUSCULAR | Status: DC | PRN
  Administered 2020-10-09: 12.5 mg via INTRAVENOUS

## 2020-10-09 MED ORDER — DEXAMETHASONE SODIUM PHOSPHATE 10 MG/ML IJ SOLN
INTRAMUSCULAR | Status: AC
  Filled 2020-10-09: qty 1

## 2020-10-09 MED ORDER — LIDOCAINE 2% (20 MG/ML) 5 ML SYRINGE
INTRAMUSCULAR | Status: DC | PRN
  Administered 2020-10-09: 50 mg via INTRAVENOUS

## 2020-10-09 MED ORDER — APREPITANT 40 MG PO CAPS
40.0000 mg | ORAL_CAPSULE | Freq: Once | ORAL | Status: AC
  Administered 2020-10-09: 40 mg via ORAL
  Filled 2020-10-09: qty 1

## 2020-10-09 MED ORDER — FENTANYL CITRATE (PF) 250 MCG/5ML IJ SOLN
INTRAMUSCULAR | Status: AC
  Filled 2020-10-09: qty 5

## 2020-10-09 MED ORDER — OXYCODONE HCL 5 MG PO TABS
5.0000 mg | ORAL_TABLET | Freq: Once | ORAL | Status: AC | PRN
  Administered 2020-10-09: 5 mg via ORAL

## 2020-10-09 MED ORDER — ACETAMINOPHEN 650 MG RE SUPP: 650.0000 mg | Freq: Four times a day (QID) | RECTAL | Status: DC | PRN

## 2020-10-09 MED ORDER — HYDROMORPHONE HCL 1 MG/ML IJ SOLN
1.0000 mg | INTRAMUSCULAR | Status: DC | PRN
Start: 2020-10-09 — End: 2020-10-10

## 2020-10-09 MED ORDER — CALCIUM CARBONATE 1250 (500 CA) MG PO TABS
2.0000 | ORAL_TABLET | Freq: Three times a day (TID) | ORAL | Status: DC
  Administered 2020-10-09 – 2020-10-10 (×3): 1000 mg via ORAL
  Filled 2020-10-09 (×4): qty 1

## 2020-10-09 MED ORDER — LACTATED RINGERS IV SOLN: INTRAVENOUS | Status: DC

## 2020-10-09 MED ORDER — ONDANSETRON HCL 4 MG/2ML IJ SOLN
4.0000 mg | Freq: Four times a day (QID) | INTRAMUSCULAR | Status: DC | PRN
  Filled 2020-10-09: qty 2

## 2020-10-09 MED ORDER — SODIUM CHLORIDE 0.45 % IV SOLN: INTRAVENOUS | Status: DC

## 2020-10-09 MED ORDER — OXYCODONE HCL 5 MG PO TABS
5.0000 mg | ORAL_TABLET | ORAL | Status: DC | PRN
  Administered 2020-10-09: 10 mg via ORAL
  Administered 2020-10-09: 5 mg via ORAL
  Administered 2020-10-10: 10 mg via ORAL
  Filled 2020-10-09 (×3): qty 2
  Filled 2020-10-09: qty 1

## 2020-10-09 MED ORDER — MIDAZOLAM HCL 2 MG/2ML IJ SOLN
INTRAMUSCULAR | Status: AC
  Filled 2020-10-09: qty 2

## 2020-10-09 MED ORDER — CHLORHEXIDINE GLUCONATE 0.12 % MT SOLN
15.0000 mL | Freq: Once | OROMUCOSAL | Status: AC
  Administered 2020-10-09: 15 mL via OROMUCOSAL

## 2020-10-09 MED ORDER — ONDANSETRON 4 MG PO TBDP
4.0000 mg | ORAL_TABLET | Freq: Four times a day (QID) | ORAL | Status: DC | PRN
  Administered 2020-10-09 – 2020-10-10 (×3): 4 mg via ORAL
  Filled 2020-10-09 (×2): qty 1

## 2020-10-09 MED ORDER — CIPROFLOXACIN IN D5W 400 MG/200ML IV SOLN
400.0000 mg | INTRAVENOUS | Status: AC
  Administered 2020-10-09: 400 mg via INTRAVENOUS
  Filled 2020-10-09: qty 200

## 2020-10-09 MED ORDER — PROPOFOL 1000 MG/100ML IV EMUL
INTRAVENOUS | Status: AC
  Filled 2020-10-09: qty 100

## 2020-10-09 MED ORDER — ACETAMINOPHEN 325 MG PO TABS
650.0000 mg | ORAL_TABLET | Freq: Four times a day (QID) | ORAL | Status: DC | PRN
  Administered 2020-10-09: 650 mg via ORAL
  Filled 2020-10-09 (×2): qty 2

## 2020-10-09 MED ORDER — SUGAMMADEX SODIUM 200 MG/2ML IV SOLN
INTRAVENOUS | Status: DC | PRN
  Administered 2020-10-09: 110 mg via INTRAVENOUS

## 2020-10-09 MED ORDER — OXYCODONE HCL 5 MG/5ML PO SOLN: 5.0000 mg | Freq: Once | ORAL | Status: AC | PRN

## 2020-10-09 MED ORDER — HYDROMORPHONE HCL 1 MG/ML IJ SOLN
0.2500 mg | INTRAMUSCULAR | Status: DC | PRN
  Administered 2020-10-09 (×2): 0.25 mg via INTRAVENOUS
  Administered 2020-10-09: 0.5 mg via INTRAVENOUS

## 2020-10-09 SURGICAL SUPPLY — 30 items
ATTRACTOMAT 16X20 MAGNETIC DRP (DRAPES) ×2 IMPLANT
BAG COUNTER SPONGE SURGICOUNT (BAG) ×2 IMPLANT
BLADE SURG 15 STRL LF DISP TIS (BLADE) ×1 IMPLANT
BLADE SURG 15 STRL SS (BLADE) ×2
CHLORAPREP W/TINT 26 (MISCELLANEOUS) ×2 IMPLANT
CLIP TI MEDIUM 6 (CLIP) ×4 IMPLANT
CLIP TI WIDE RED SMALL 6 (CLIP) ×12 IMPLANT
COVER SURGICAL LIGHT HANDLE (MISCELLANEOUS) ×2 IMPLANT
DERMABOND ADVANCED (GAUZE/BANDAGES/DRESSINGS) ×1
DERMABOND ADVANCED .7 DNX12 (GAUZE/BANDAGES/DRESSINGS) ×1 IMPLANT
DRAPE LAPAROTOMY T 98X78 PEDS (DRAPES) ×2 IMPLANT
DRAPE UTILITY XL STRL (DRAPES) ×2 IMPLANT
ELECT PENCIL ROCKER SW 15FT (MISCELLANEOUS) ×2 IMPLANT
ELECT REM PT RETURN 15FT ADLT (MISCELLANEOUS) ×2 IMPLANT
GAUZE 4X4 16PLY ~~LOC~~+RFID DBL (SPONGE) ×2 IMPLANT
GLOVE SURG SYN 7.5  E (GLOVE) ×4
GLOVE SURG SYN 7.5 E (GLOVE) ×2 IMPLANT
GOWN STRL REUS W/TWL XL LVL3 (GOWN DISPOSABLE) ×4 IMPLANT
HEMOSTAT SURGICEL 2X4 FIBR (HEMOSTASIS) ×2 IMPLANT
ILLUMINATOR WAVEGUIDE N/F (MISCELLANEOUS) ×2 IMPLANT
KIT BASIN OR (CUSTOM PROCEDURE TRAY) ×2 IMPLANT
KIT TURNOVER KIT A (KITS) ×2 IMPLANT
PACK BASIC VI WITH GOWN DISP (CUSTOM PROCEDURE TRAY) ×2 IMPLANT
SHEARS HARMONIC 9CM CVD (BLADE) ×2 IMPLANT
SUT MNCRL AB 4-0 PS2 18 (SUTURE) ×2 IMPLANT
SUT VIC AB 3-0 SH 18 (SUTURE) ×4 IMPLANT
SYR BULB IRRIG 60ML STRL (SYRINGE) ×2 IMPLANT
TOWEL OR 17X26 10 PK STRL BLUE (TOWEL DISPOSABLE) ×2 IMPLANT
TOWEL OR NON WOVEN STRL DISP B (DISPOSABLE) ×2 IMPLANT
TUBING CONNECTING 10 (TUBING) ×2 IMPLANT

## 2020-10-09 NOTE — Transfer of Care (Signed)
Immediate Anesthesia Transfer of Care Note  Patient: Saryiah L Mcguffee  Procedure(s) Performed: TOTAL THYROIDECTOMY (Bilateral: Neck)  Patient Location: PACU  Anesthesia Type:General  Level of Consciousness: sedated  Airway & Oxygen Therapy: Patient Spontanous Breathing and Patient connected to face mask oxygen  Post-op Assessment: Report given to RN and Post -op Vital signs reviewed and stable  Post vital signs: Reviewed and stable  Last Vitals:  Vitals Value Taken Time  BP    Temp    Pulse 51 10/09/20 0920  Resp    SpO2 100 % 10/09/20 0920  Vitals shown include unvalidated device data.  Last Pain:  Vitals:   10/09/20 0614  TempSrc:   PainSc: 0-No pain         Complications: No notable events documented.

## 2020-10-09 NOTE — Anesthesia Procedure Notes (Addendum)
Procedure Name: Intubation Date/Time: 10/09/2020 7:31 AM Performed by: Nolon Nations, MD Pre-anesthesia Checklist: Patient identified, Emergency Drugs available, Suction available, Patient being monitored and Timeout performed Patient Re-evaluated:Patient Re-evaluated prior to induction Oxygen Delivery Method: Circle system utilized Preoxygenation: Pre-oxygenation with 100% oxygen Induction Type: IV induction Ventilation: Mask ventilation without difficulty Laryngoscope Size: Mac and 3 Grade View: Grade I Tube type: Oral Tube size: 7.0 mm Number of attempts: 1 Airway Equipment and Method: Stylet Placement Confirmation: ETT inserted through vocal cords under direct vision, positive ETCO2, breath sounds checked- equal and bilateral and CO2 detector Secured at: 21 cm Tube secured with: Tape Dental Injury: Teeth and Oropharynx as per pre-operative assessment  Comments: Performed by Loura Pardon

## 2020-10-09 NOTE — Anesthesia Postprocedure Evaluation (Signed)
Anesthesia Post Note  Patient: Monique Long  Procedure(s) Performed: TOTAL THYROIDECTOMY (Bilateral: Neck)     Patient location during evaluation: PACU Anesthesia Type: General Level of consciousness: sedated and patient cooperative Pain management: pain level controlled Vital Signs Assessment: post-procedure vital signs reviewed and stable Respiratory status: spontaneous breathing Cardiovascular status: stable Anesthetic complications: no   No notable events documented.  Last Vitals:  Vitals:   10/09/20 1404 10/09/20 1802  BP: 110/62 108/61  Pulse: (!) 52 60  Resp: 18 16  Temp: 36.7 C 36.8 C  SpO2: 100% 99%    Last Pain:  Vitals:   10/09/20 1802  TempSrc: Oral  PainSc:                  Nolon Nations

## 2020-10-09 NOTE — Op Note (Signed)
Procedure Note  Pre-operative Diagnosis:  thyroid neoplasm of uncertain behavior, multiple thyroid nodules, Hashimoto's thyroiditis  Post-operative Diagnosis:  same  Surgeon:  Armandina Gemma, MD  Assistant:  Carlena Hurl, PA-C   Procedure:  Total thyroidectomy  Anesthesia:  General  Estimated Blood Loss:  minimal  Drains: none         Specimen: thyroid to pathology  Indications:  Patient is referred by Dr. Glade Lloyd for surgical evaluation and management of a newly diagnosed thyroid neoplasm of uncertain behavior.  Patient has been under the care of her endocrinologist for approximately one year for hypothyroidism and Hashimoto's thyroiditis.  She is currently taking levothyroxine 100 g daily.  Her most recent TSH level was normal at 0.964.  Patient underwent an ultrasound examination on May 20, 2020.  This demonstrated bilateral thyroid nodules with an 8 mm nodule in the right thyroid lobe and a 1.6 cm nodule in the left thyroid lobe.  This was felt to be moderately suspicious and fine-needle aspiration biopsy was performed on Jul 20, 2020.  This demonstrated atypia of undetermined significance, Bethesda category III.  The specimen was sent for molecular genetic testing, AFIRMA, and returned with a result of suspicious, rendering a risk of malignancy of 50%.  Patient is therefore referred for consideration for resection for definitive diagnosis and management.    Procedure Details: Procedure was done in OR #1 at the [facility]. The patient was brought to the operating room and placed in a supine position on the operating room table. Following administration of general anesthesia, the patient was positioned and then prepped and draped in the usual aseptic fashion. After ascertaining that an adequate level of anesthesia had been achieved, a small Kocher incision was made with #15 blade. Dissection was carried through subcutaneous tissues and platysma.Hemostasis was achieved with the  electrocautery. Skin flaps were elevated cephalad and caudad from the thyroid notch to the sternal notch. A Mahorner self-retaining retractor was placed for exposure. Strap muscles were incised in the midline and dissection was begun on the left side.  Strap muscles were reflected laterally.  Left thyroid lobe was small with nodules.  The left lobe was gently mobilized with blunt dissection. Superior pole vessels were dissected out and divided individually between small and medium ligaclips with the harmonic scalpel. The thyroid lobe was rolled anteriorly. Branches of the inferior thyroid artery were divided between small ligaclips with the harmonic scalpel. Inferior venous tributaries were divided between ligaclips. Both the superior and inferior parathyroid glands were identified and preserved on their vascular pedicles. The recurrent laryngeal nerve was identified and preserved along its course. The ligament of Gwenlyn Found was released with the electrocautery and the gland was mobilized onto the anterior trachea. Isthmus was mobilized across the midline. There was no significant pyramidal lobe present. Dry pack was placed in the left neck.  The right thyroid lobe was gently mobilized with blunt dissection. Right thyroid lobe was mildly enlarged with nodules. Superior pole vessels were dissected out and divided between small and medium ligaclips with the Harmonic scalpel. Superior parathyroid was identified and preserved. Inferior venous tributaries were divided between medium ligaclips with the harmonic scalpel. The right thyroid lobe was rolled anteriorly and the branches of the inferior thyroid artery divided between small ligaclips. The right recurrent laryngeal nerve was identified and preserved along its course. The ligament of Gwenlyn Found was released with the electrocautery. The right thyroid lobe was mobilized onto the anterior trachea and the remainder of the thyroid was  dissected off the anterior trachea and the  thyroid was completely excised. A suture was used to mark the right lobe. The entire thyroid gland was submitted to pathology for review.  The neck was irrigated with warm saline. Fibrillar was placed throughout the operative field. Strap muscles were approximated in the midline with interrupted 3-0 Vicryl sutures. Platysma was closed with interrupted 3-0 Vicryl sutures. Skin was closed with a running 4-0 Monocryl subcuticular suture. Wound was washed and Dermabond was applied. The patient was awakened from anesthesia and brought to the recovery room. The patient tolerated the procedure well.   Armandina Gemma, MD [facility] Office: [phone]

## 2020-10-09 NOTE — Interval H&P Note (Signed)
History and Physical Interval Note:  10/09/2020 6:57 AM  Monique Long  has presented today for surgery, with the diagnosis of THYROID NEOPLASM OF UNCERTAIN BEHAVIOR, MULTIPLE THYROID NODULES.  The various methods of treatment have been discussed with the patient and family. After consideration of risks, benefits and other options for treatment, the patient has consented to    Procedure(s): TOTAL THYROIDECTOMY (Bilateral) as a surgical intervention.    The patient's history has been reviewed, patient examined, no change in status, stable for surgery.  I have reviewed the patient's chart and labs.  Questions were answered to the patient's satisfaction.    Armandina Gemma, MD [facility] Office: [place]

## 2020-10-10 ENCOUNTER — Encounter (HOSPITAL_COMMUNITY): Payer: Self-pay | Admitting: Surgery

## 2020-10-10 DIAGNOSIS — D34 Benign neoplasm of thyroid gland: Secondary | ICD-10-CM | POA: Diagnosis not present

## 2020-10-10 LAB — BASIC METABOLIC PANEL
Anion gap: 7 (ref 5–15)
BUN: 8 mg/dL (ref 6–20)
CO2: 27 mmol/L (ref 22–32)
Calcium: 8.6 mg/dL — ABNORMAL LOW (ref 8.9–10.3)
Chloride: 102 mmol/L (ref 98–111)
Creatinine, Ser: 0.69 mg/dL (ref 0.44–1.00)
GFR, Estimated: >60 mL/min (ref >60)
Glucose, Bld: 103 mg/dL — ABNORMAL HIGH (ref 70–99)
Potassium: 3.4 mmol/L — ABNORMAL LOW (ref 3.5–5.1)
Sodium: 136 mmol/L (ref 135–145)

## 2020-10-10 LAB — SURGICAL PATHOLOGY

## 2020-10-10 MED ORDER — LEVOTHYROXINE SODIUM 100 MCG PO TABS: 100.0000 ug | ORAL_TABLET | Freq: Every day | ORAL | 3 refills | Status: DC

## 2020-10-10 MED ORDER — CALCIUM CARBONATE ANTACID 500 MG PO CHEW: 2.0000 | CHEWABLE_TABLET | Freq: Two times a day (BID) | ORAL | 1 refills | Status: DC

## 2020-10-10 MED ORDER — OXYCODONE HCL 5 MG PO TABS: 5.0000 mg | ORAL_TABLET | Freq: Four times a day (QID) | ORAL | 0 refills | Status: DC | PRN

## 2020-10-10 MED ORDER — PROMETHAZINE HCL 25 MG PO TABS: 25.0000 mg | ORAL_TABLET | Freq: Four times a day (QID) | ORAL | 0 refills | Status: DC | PRN

## 2020-10-10 NOTE — Discharge Summary (Signed)
Physician Discharge Summary [facility]  Patient ID: Monique Long MRN: [id number] DOB/AGE: 06/18/2000 20 y.o.  Admit date: 10/09/2020  Discharge date: 10/10/2020  Discharge Diagnoses:  Principal Problem:   Neoplasm of uncertain behavior of thyroid gland Active Problems:   Hypothyroidism due to Hashimoto's thyroiditis   Multiple thyroid nodules   Discharged Condition: good  Hospital Course: Patient was admitted for observation following thyroid surgery.  Post op course was uncomplicated.  Pain was well controlled.  Tolerated diet.  Post op calcium level on morning following surgery was 8.6 mg/dl.  Patient was prepared for discharge home on POD#1.   Consults: None  Treatments: surgery: total thyroidectomy  Discharge Exam: Blood pressure (!) 92/52, pulse (!) 51, temperature 98.2 F (36.8 C), temperature source Oral, resp. rate 17, height '5\' 6"'$  (1.676 m), weight 54.4 kg, last menstrual period 09/27/2020, SpO2 100 %. HEENT - clear Neck - wound dry and intact; mild STS; Dermabond in place; voice normal Chest - clear bilaterally Cor - RRR   Disposition: Home  Discharge Instructions     Diet - low sodium heart healthy   Complete by: As directed    Increase activity slowly   Complete by: As directed    No dressing needed   Complete by: As directed       Allergies as of 10/10/2020       Reactions   Amoxicillin Rash   Penicillins Rash        Medication List     TAKE these medications    calcium carbonate 500 MG chewable tablet Commonly known as: Tums Chew 2 tablets (400 mg of elemental calcium total) by mouth 2 (two) times daily.   levothyroxine 100 MCG tablet Commonly known as: SYNTHROID Take 1 tablet (100 mcg total) by mouth daily before breakfast. What changed: Another medication with the same name was added. Make sure you understand how and when to take each.   levothyroxine 100 MCG tablet Commonly known as: Synthroid Take 1 tablet  (100 mcg total) by mouth daily before breakfast. What changed: You were already taking a medication with the same name, and this prescription was added. Make sure you understand how and when to take each.   ondansetron 4 MG tablet Commonly known as: ZOFRAN Take 1 tablet (4 mg total) by mouth every 6 (six) hours. As needed for nausea vomiting   oxyCODONE 5 MG immediate release tablet Commonly known as: Oxy IR/ROXICODONE Take 1-2 tablets (5-10 mg total) by mouth every 6 (six) hours as needed for moderate pain.   promethazine 25 MG tablet Commonly known as: PHENERGAN Take 1 tablet (25 mg total) by mouth every 6 (six) hours as needed for nausea.       ASK your doctor about these medications    pantoprazole 20 MG tablet Commonly known as: PROTONIX Take 1 tablet (20 mg total) by mouth daily.               Discharge Care Instructions  (From admission, onward)           Start     Ordered   10/10/20 0000  No dressing needed        10/10/20 [id number]            Follow-up Information     Armandina Gemma, MD. Schedule an appointment as soon as possible for a visit in 3 week(s).   Specialty: General Surgery Why: For wound re-check Contact information: [address] [place] [address] [phone]                 Jasmeen Fritsch, [facility] Office: [phone]   Signed: Armandina Gemma 10/10/2020, 9:28 AM

## 2020-10-10 NOTE — Discharge Instructions (Signed)
[facility]  THYROID & PARATHYROID SURGERY:  POST-OP INSTRUCTIONS  Always review your discharge instruction sheet from the facility where your surgery was performed.  A prescription for pain medication may be given to you upon discharge.  Take your pain medication as prescribed.  If narcotic pain medicine is not needed, then you may take acetaminophen (Tylenol) or ibuprofen (Advil) as needed.  Take your usually prescribed medications unless otherwise directed.  If you need a refill on your pain medication, please contact our office during regular business hours.  Prescriptions cannot be processed by our office after 5 pm or on weekends.  Start with a light diet upon arrival home, such as soup and crackers or toast.  Be sure to drink plenty of fluids daily.  Resume your normal diet the day after surgery.  Most patients will experience some swelling and bruising on the chest and neck area.  Ice packs will help.  Swelling and bruising can take several days to resolve.   It is common to experience some constipation after surgery.  Increasing fluid intake and taking a stool softener (Colace) will usually help or prevent this problem.  A mild laxative (Milk of Magnesia or Miralax) should be taken according to package directions if there has been no bowel movement after 48 hours.  You have steri-strips and a gauze dressing over your incision.  You may remove the gauze bandage on the second day after surgery, and you may shower at that time.  Leave your steri-strips (small skin tapes) in place directly over the incision.  These strips should remain on the skin for 5-7 days and then be removed.  You may get them wet in the shower and pat them dry.  You may resume regular (light) daily activities beginning the next day (such as daily self-care, walking, climbing stairs) gradually increasing activities as tolerated.  You may have sexual intercourse when it is comfortable.  Refrain from  any heavy lifting or straining until approved by your doctor.  You may drive when you no longer are taking prescription pain medication, you can comfortably wear a seatbelt, and you can safely maneuver your car and apply brakes.  You should see your doctor in the office for a follow-up appointment approximately three weeks after your surgery.  Make sure that you call for this appointment within a day or two after you arrive home to insure a convenient appointment time.  WHEN TO CALL YOUR DOCTOR: -- Fever greater than 101.5 -- Inability to urinate -- Nausea and/or vomiting - persistent -- Extreme swelling or bruising -- Continued bleeding from incision -- Increased pain, redness, or drainage from the incision -- Difficulty swallowing or breathing -- Muscle cramping or spasms -- Numbness or tingling in hands or around lips  The clinic staff is available to answer your questions during regular business hours.  Please don't hesitate to call and ask to speak to one of the nurses if you have concerns.  Alayza Pieper, MD [facility] Office: [phone] 

## 2020-10-10 NOTE — Progress Notes (Signed)
Discharge instructions discussed with patient, verbalized agreement and understanding 

## 2020-10-11 NOTE — Progress Notes (Signed)
Final pathology is benign - good news!  Armandina Gemma, MD [facility] Office: [phone]

## 2020-11-15 ENCOUNTER — Telehealth: Payer: Self-pay

## 2020-11-15 NOTE — Telephone Encounter (Signed)
Pt had her thyroid removed on 8/1, when does she need to do labs and come back for a visit

## 2020-11-16 ENCOUNTER — Other Ambulatory Visit: Payer: Self-pay | Admitting: "Endocrinology

## 2020-11-16 DIAGNOSIS — E063 Autoimmune thyroiditis: Secondary | ICD-10-CM

## 2020-11-16 DIAGNOSIS — E038 Other specified hypothyroidism: Secondary | ICD-10-CM

## 2020-11-16 NOTE — Telephone Encounter (Signed)
Discussed with pt the need to have her lab work performed asap. She stated she has moved to [facility]. I advised her I would mail her lab orders to her and as soon as she has her labs drawn to call the office to be scheduled for an appointment. Understanding voiced.

## 2020-11-27 NOTE — Telephone Encounter (Signed)
Pt's lab order came back in the mail. Call to pt - to the number in chart, not working. Tried to call the patient at [phone], that is the number she called from this morning. No answer

## 2020-12-02 LAB — COMPREHENSIVE METABOLIC PANEL
ALT: 19 IU/L (ref 0–32)
AST: 21 IU/L (ref 0–40)
Albumin/Globulin Ratio: 2.8 — ABNORMAL HIGH (ref 1.2–2.2)
Albumin: 5 g/dL (ref 3.9–5.0)
Alkaline Phosphatase: 57 IU/L (ref 42–106)
BUN/Creatinine Ratio: 15 (ref 9–23)
BUN: 13 mg/dL (ref 6–20)
Bilirubin Total: 0.3 mg/dL (ref 0.0–1.2)
CO2: 23 mmol/L (ref 20–29)
Calcium: 9.1 mg/dL (ref 8.7–10.2)
Chloride: 104 mmol/L (ref 96–106)
Creatinine, Ser: 0.86 mg/dL (ref 0.57–1.00)
Globulin, Total: 1.8 g/dL (ref 1.5–4.5)
Glucose: 82 mg/dL (ref 65–99)
Potassium: 4.1 mmol/L (ref 3.5–5.2)
Sodium: 140 mmol/L (ref 134–144)
Total Protein: 6.8 g/dL (ref 6.0–8.5)
eGFR: 99 mL/min/{1.73_m2} (ref >59)

## 2020-12-02 LAB — TSH: TSH: 10 u[IU]/mL — ABNORMAL HIGH (ref 0.450–4.500)

## 2020-12-02 LAB — T4, FREE: Free T4: 1.46 ng/dL (ref 0.82–1.77)

## 2020-12-04 ENCOUNTER — Encounter: Payer: Self-pay | Admitting: "Endocrinology

## 2020-12-04 ENCOUNTER — Ambulatory Visit (INDEPENDENT_AMBULATORY_CARE_PROVIDER_SITE_OTHER): Payer: Medicaid Other | Admitting: "Endocrinology

## 2020-12-04 VITALS — BP 98/56 | HR 76 | Ht 67.0 in | Wt 122.0 lb

## 2020-12-04 DIAGNOSIS — E038 Other specified hypothyroidism: Secondary | ICD-10-CM

## 2020-12-04 DIAGNOSIS — E063 Autoimmune thyroiditis: Secondary | ICD-10-CM

## 2020-12-04 DIAGNOSIS — Z8349 Family history of other endocrine, nutritional and metabolic diseases: Secondary | ICD-10-CM | POA: Diagnosis not present

## 2020-12-04 MED ORDER — LEVOTHYROXINE SODIUM 100 MCG PO TABS: 100.0000 ug | ORAL_TABLET | Freq: Every day | ORAL | 1 refills | Status: DC

## 2020-12-04 MED ORDER — CALCIUM CARBONATE 1250 (500 CA) MG PO TABS: 1.0000 | ORAL_TABLET | Freq: Every day | ORAL | 1 refills | Status: DC

## 2020-12-04 NOTE — Progress Notes (Signed)
12/04/2020, 3:18 PM      Endocrinology follow-up note   Monique Long is a 20 y.o.-year-old female patient being seen in follow-up for postsurgical hypothyroidism.   Past Medical History:  Diagnosis Date   Anxiety    Asthma    Cancer ([place])    Depression    Dysrhythmia    Hypothyroidism    Thyroid disease    Phreesia 02/29/2020    Past Surgical History:  Procedure Laterality Date   NO PAST SURGERIES     THYROIDECTOMY Bilateral 10/09/2020   Procedure: TOTAL THYROIDECTOMY;  Surgeon: Armandina Gemma, MD;  Location: [facility];  Service: General;  Laterality: Bilateral;    Social History   Socioeconomic History   Marital status: Single    Spouse name: Not on file   Number of children: Not on file   Years of education: Not on file   Highest education level: Not on file  Occupational History   Not on file  Tobacco Use   Smoking status: Never   Smokeless tobacco: Never  Vaping Use   Vaping Use: Every day   Substances: Nicotine  Substance and Sexual Activity   Alcohol use: No   Drug use: Not Currently   Sexual activity: Not on file  Other Topics Concern   Not on file  Social History Narrative   Not on file   Social Determinants of Health   Financial Resource Strain: Not on file  Food Insecurity: Not on file  Transportation Needs: Not on file  Physical Activity: Not on file  Stress: Not on file  Social Connections: Not on file    Family History  Problem Relation Age of Onset   Hypertension Mother    Heart failure Mother    Cancer Father     Outpatient Encounter Medications as of 12/04/2020  Medication Sig   calcium carbonate (OS-CAL - DOSED IN MG OF ELEMENTAL CALCIUM) 1250 (500 Ca) MG tablet Take 1 tablet (500 mg of elemental calcium total) by mouth daily with lunch.   levothyroxine (SYNTHROID) 100 MCG tablet Take 1 tablet (100 mcg total) by mouth  daily before breakfast.   ondansetron (ZOFRAN) 4 MG tablet Take 1 tablet (4 mg total) by mouth every 6 (six) hours. As needed for nausea vomiting (Patient not taking: Reported on 12/04/2020)   oxyCODONE (OXY IR/ROXICODONE) 5 MG immediate release tablet Take 1-2 tablets (5-10 mg total) by mouth every 6 (six) hours as needed for moderate pain. (Patient not taking: Reported on 12/04/2020)   promethazine (PHENERGAN) 25 MG tablet Take 1 tablet (25 mg total) by mouth every 6 (six) hours as needed for nausea. (Patient not taking: Reported on 12/04/2020)   [DISCONTINUED] calcium carbonate (TUMS) 500 MG chewable tablet Chew 2 tablets (400 mg of elemental calcium total) by mouth 2 (two) times daily. (Patient not  taking: Reported on 12/04/2020)   [DISCONTINUED] levothyroxine (SYNTHROID) 100 MCG tablet Take 1 tablet (100 mcg total) by mouth daily before breakfast.   [DISCONTINUED] levothyroxine (SYNTHROID) 100 MCG tablet Take 1 tablet (100 mcg total) by mouth daily before breakfast.   [DISCONTINUED] pantoprazole (PROTONIX) 20 MG tablet Take 1 tablet (20 mg total) by mouth daily. (Patient not taking: Reported on 09/29/2020)   No facility-administered encounter medications on file as of 12/04/2020.    ALLERGIES: Allergies  Allergen Reactions   Amoxicillin Rash   Penicillins Rash   VACCINATION STATUS: Immunization History  Administered Date(s) Administered   Hepatitis B, ped/adol 09-Dec-2000     HPI    Monique Long  is a patient with the above medical history.  She was determined to have abnormal fine-needle aspiration biopsy suspicious for malignancy in her previous visits.  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   She is currently on levothyroxine 100 mcg p.o. daily.  She has no new complaints.  She continues to feel better. After her fine-needle aspiration showed atypia of undetermined significance, A sample was sent  for Afirma which confirms approximately 50% risk of malignancy.  Her surgical sample did not show malignancy.   she reports family history of thyroid dysfunction in one of her aunts taking thyroid hormone replacement.  - No family history of thyroid cancer, reports what appears to be head and neck cancer in her father which likely metastasized locally including to his thyroid,  who was a smoker. No history of  radiation therapy to head or neck. No recent use of iodine supplements.   ROS: Limited as above.  Physical Exam: BP (!) 98/56   Pulse 76   Ht 5\' 7"  (1.702 m)   Wt 122 lb (55.3 kg)   BMI 19.11 kg/m  Wt Readings from Last 3 Encounters:  12/04/20 122 lb (55.3 kg)  10/09/20 120 lb (54.4 kg)  09/06/20 120 lb (54.4 kg)     ASSESSMENT: 1. Hypothyroidism due to Hashimoto's thyroiditis 2.  Hypocalcemia   PLAN:  Her surgical sample was negative for malignancy despite the fact that Afirma showed 50% risk of malignancy based on atypia of undetermined significance.  She will not need remnant ablation at this time.  For her postsurgical hypothyroidism, she is advised to continue levothyroxine 100 mcg daily before breakfast.   - We discussed about the correct intake of her thyroid hormone, on empty stomach at fasting, with water, separated by at least 30 minutes from breakfast and other medications,  and separated by more than 4 hours from calcium, iron, multivitamins, acid reflux medications (PPIs). -Patient is made aware of the fact that thyroid hormone replacement is needed for life, dose to be adjusted by periodic monitoring of thyroid function tests. For her hypocalcemia, she is advised to continue calcium carbonate 1250 mg p.o. daily at lunch with plan to repeat calcium along with her next thyroid function tests in 6 months. She is advised to maintain close follow-up with her PCP.   I spent 25 minutes in the care of the patient today including review of labs from Thyroid  Function, CMP, and other relevant labs ; imaging/biopsy records (current and previous including abstractions from other facilities); face-to-face time discussing  her lab results and symptoms, medications doses, her options of short and long term treatment based on the latest standards of care / guidelines;   and documenting the encounter.  [place]  participated in the discussions, expressed understanding, and voiced agreement with the above plans.  All questions were answered to her satisfaction. she is encouraged to contact clinic should she have any questions or concerns prior to her return visit.   Return in about 6 months (around 06/03/2021) for F/U with Pre-visit Labs.  Glade Lloyd, MD [facility] [address] [place], [place] [address] Phone: [phone]  Fax: [phone]   12/04/2020, 3:18 PM  This note was partially dictated with voice recognition software. Similar sounding words can be transcribed inadequately or may not  be corrected upon review.

## 2021-01-09 ENCOUNTER — Telehealth: Payer: Self-pay | Admitting: "Endocrinology

## 2021-01-09 NOTE — Telephone Encounter (Signed)
Pt is calling and is crying states she needs Dr Dorris Fetch to call her back asap, because she has been without her thyroid medication for 2 days and is waiting on the pharmacy to mail her medicine to her. She feels like her body is trembling and wants to know if it is from her not having her thyroid medicine in 2 days. Pt can be reached at [phone]

## 2021-01-09 NOTE — Telephone Encounter (Signed)
Discussed with pt, she voiced understanding. Called in Rx for levothyroxine 187mcg p.o.daily before breakfast, quantity of 30 with 1 refill to [facility] in [place], [place] per Dr.Nida's orders and pt's request.

## 2021-01-12 ENCOUNTER — Ambulatory Visit: Payer: Medicaid Other | Admitting: Internal Medicine

## 2021-01-17 ENCOUNTER — Ambulatory Visit: Payer: Medicaid Other | Admitting: Internal Medicine

## 2021-01-18 ENCOUNTER — Encounter: Payer: Self-pay | Admitting: Internal Medicine

## 2021-01-18 ENCOUNTER — Ambulatory Visit (INDEPENDENT_AMBULATORY_CARE_PROVIDER_SITE_OTHER): Payer: Medicaid Other | Admitting: Internal Medicine

## 2021-01-18 ENCOUNTER — Other Ambulatory Visit: Payer: Self-pay

## 2021-01-18 DIAGNOSIS — N926 Irregular menstruation, unspecified: Secondary | ICD-10-CM | POA: Diagnosis not present

## 2021-01-18 DIAGNOSIS — E038 Other specified hypothyroidism: Secondary | ICD-10-CM

## 2021-01-18 DIAGNOSIS — E063 Autoimmune thyroiditis: Secondary | ICD-10-CM

## 2021-01-18 NOTE — Progress Notes (Signed)
Virtual Visit via Telephone Note   This visit type was conducted due to national recommendations for restrictions regarding the COVID-19 Pandemic (e.g. social distancing) in an effort to limit this patient's exposure and mitigate transmission in our community.  Due to her co-morbid illnesses, this patient is at least at moderate risk for complications without adequate follow up.  This format is felt to be most appropriate for this patient at this time.  The patient did not have access to video technology/had technical difficulties with video requiring transitioning to audio format only (telephone).  All issues noted in this document were discussed and addressed.  No physical exam could be performed with this format.  Evaluation Performed:  Follow-up visit  Date:  01/18/2021   ID:  Monique Long, DOB 12/13/2000, MRN [id number]  Patient Location: Home Provider Location: Office/Clinic  Participants: Patient Location of Patient: Home Location of Provider: Telehealth Consent was obtain for visit to be over via telehealth. I verified that I am speaking with the correct person using two identifiers.  PCP:  Lindell Spar, MD   Chief Complaint: Follow up of chronic medical conditions  History of Present Illness:    Monique Long is a 20 y.o. female with PMH of hypothyroidism (Hashimoto thyroiditis) and irregular menstrual cycles who has a televisit for follow up of her chronic medical conditions.  Hypothyroidism and thyroid nodules: She has been taking Levothyroxine regularly. She has had total thyroidectomy due to suspicious biopsy result.  She is placed on levothyroxine 100 mcg daily now.  Her TSH was 10 in 11/2020.  She is going to see OB/GYN for fertility guidance in the next week.  The patient does not have symptoms concerning for COVID-19 infection (fever, chills, cough, or new shortness of breath).   Past Medical, Surgical, Social History, Allergies, and Medications have been  Reviewed.  Past Medical History:  Diagnosis Date   Anxiety    Asthma    Cancer ([place])    Depression    Dysrhythmia    Hypothyroidism    Thyroid disease    Phreesia 02/29/2020   Past Surgical History:  Procedure Laterality Date   NO PAST SURGERIES     THYROIDECTOMY Bilateral 10/09/2020   Procedure: TOTAL THYROIDECTOMY;  Surgeon: Armandina Gemma, MD;  Location: [facility];  Service: General;  Laterality: Bilateral;     Current Meds  Medication Sig   calcium carbonate (OS-CAL - DOSED IN MG OF ELEMENTAL CALCIUM) 1250 (500 Ca) MG tablet Take 1 tablet (500 mg of elemental calcium total) by mouth daily with lunch.   levothyroxine (SYNTHROID) 100 MCG tablet Take 1 tablet (100 mcg total) by mouth daily before breakfast.     Allergies:   Amoxicillin and Penicillins   ROS:   Please see the history of present illness.     All other systems reviewed and are negative.   Labs/Other Tests and Data Reviewed:    Recent Labs: 10/05/2020: Hemoglobin 12.5; Platelets 210 12/01/2020: ALT 19; BUN 13; Creatinine, Ser 0.86; Potassium 4.1; Sodium 140; TSH 10.000   Recent Lipid Panel Lab Results  Component Value Date/Time   CHOL 147 02/23/2020 10:19 AM   TRIG 67 02/23/2020 10:19 AM   HDL 44 02/23/2020 10:19 AM   CHOLHDL 3.3 02/23/2020 10:19 AM   LDLCALC 90 02/23/2020 10:19 AM    Wt Readings from Last 3 Encounters:  12/04/20 122 lb (55.3 kg)  10/09/20 120 lb (54.4 kg)  09/06/20 120 lb (54.4 kg)  ASSESSMENT & PLAN:    Hypothyroidism due to Hashimoto's thyroiditis Lab Results  Component Value Date   TSH 10.000 (H) 12/01/2020   On Levothyroxine 100 mcg QD S/p total thyroidectomy for thyroid nodules, biopsy negative for malignancy Followed by Dr. Dorris Fetch Needs to get TSH and free T4 done  Menstruation, irregular Follows up with Ob/Gyn - advised to discuss about irregular menstrual cycles Will need TSH and free T4 testing for fertility concern as well    Time:   Today, I have spent 12  minutes reviewing the chart, including problem list, medications, and with the patient with telehealth technology discussing the above problems.   Medication Adjustments/Labs and Tests Ordered: Current medicines are reviewed at length with the patient today.  Concerns regarding medicines are outlined above.   Tests Ordered: No orders of the defined types were placed in this encounter.   Medication Changes: No orders of the defined types were placed in this encounter.    Note: This dictation was prepared with Dragon dictation along with smaller phrase technology. Similar sounding words can be transcribed inadequately or may not be corrected upon review. Any transcriptional errors that result from this process are unintentional.      Disposition:  Follow up  Signed, Lindell Spar, MD  01/18/2021 4:55 PM     [place]

## 2021-01-18 NOTE — Assessment & Plan Note (Addendum)
Lab Results  Component Value Date   TSH 10.000 (H) 12/01/2020   On Levothyroxine 100 mcg QD S/p total thyroidectomy for thyroid nodules, biopsy negative for malignancy Followed by Dr. Dorris Fetch Needs to get TSH and free T4 done

## 2021-01-18 NOTE — Assessment & Plan Note (Signed)
Follows up with Ob/Gyn - advised to discuss about irregular menstrual cycles Will need TSH and free T4 testing for fertility concern as well

## 2021-01-18 NOTE — Patient Instructions (Signed)
Please get TSH and free T4 done after Ob/Gyn visit.

## 2021-02-05 ENCOUNTER — Telehealth: Payer: Self-pay | Admitting: "Endocrinology

## 2021-02-05 NOTE — Telephone Encounter (Signed)
Discussed with pt to decrease her levothyroxine to 18mcg 1 daily per Dr.Nida's orders. Pt scheduled for an appointment in 7 weeks on January 17 at 1:00p.m., advised pt to have lab work drawn a week ahead of her appoinment per [place].

## 2021-02-05 NOTE — Telephone Encounter (Signed)
Pt left a voicemail and states she went to her OB and had blood work done including a TSH and states it came back as 56.5. she is requesting a call back at number [phone]

## 2021-02-05 NOTE — Telephone Encounter (Signed)
Pt had lab work drawn for Dr.Buist which showed her TSH level as 56.500. Pt has been taking levothyroxine 179mcg daily. Pt lab results are on your desk.

## 2021-02-05 NOTE — Telephone Encounter (Signed)
Discussed with pt, she stated Dr.Buist increased her levothyroxine 161mcg to 2 each morning on 11/23.

## 2021-03-11 ENCOUNTER — Encounter (HOSPITAL_COMMUNITY): Payer: Self-pay | Admitting: Emergency Medicine

## 2021-03-11 ENCOUNTER — Emergency Department (HOSPITAL_COMMUNITY)
Admission: EM | Admit: 2021-03-11 | Discharge: 2021-03-11 | Disposition: A | Discharge status: Home / Self Care | Payer: Medicaid Other | Attending: Emergency Medicine | Admitting: Emergency Medicine

## 2021-03-11 DIAGNOSIS — K0889 Other specified disorders of teeth and supporting structures: Secondary | ICD-10-CM

## 2021-03-11 DIAGNOSIS — Z3A00 Weeks of gestation of pregnancy not specified: Secondary | ICD-10-CM | POA: Diagnosis not present

## 2021-03-11 DIAGNOSIS — O26891 Other specified pregnancy related conditions, first trimester: Secondary | ICD-10-CM | POA: Insufficient documentation

## 2021-03-11 MED ORDER — HYDROCODONE-ACETAMINOPHEN 5-325 MG PO TABS
2.0000 | ORAL_TABLET | Freq: Once | ORAL | Status: AC
  Administered 2021-03-11: 2 via ORAL
  Filled 2021-03-11: qty 2

## 2021-03-11 MED ORDER — CLINDAMYCIN HCL 300 MG PO CAPS: 300.0000 mg | ORAL_CAPSULE | Freq: Four times a day (QID) | ORAL | 0 refills | Status: DC

## 2021-03-11 MED ORDER — CLINDAMYCIN HCL 150 MG PO CAPS
300.0000 mg | ORAL_CAPSULE | Freq: Once | ORAL | Status: AC
  Administered 2021-03-11: 300 mg via ORAL
  Filled 2021-03-11: qty 2

## 2021-03-11 MED ORDER — HYDROCODONE-ACETAMINOPHEN 5-325 MG PO TABS: 1.0000 | ORAL_TABLET | Freq: Four times a day (QID) | ORAL | 0 refills | Status: DC | PRN

## 2021-03-11 NOTE — Discharge Instructions (Signed)
Begin taking clindamycin as prescribed.  Begin taking hydrocodone as prescribed as needed for pain.  Follow-up with your dentist in the next few days.

## 2021-03-11 NOTE — ED Provider Notes (Addendum)
[facility] EMERGENCY DEPARTMENT Provider Note   CSN: [id number] Arrival date & time: 03/11/21  1607     History  Chief Complaint  Patient presents with   Dental Pain    Monique Long is a 21 y.o. female.  Patient is a 21 year old female with no significant past medical history, but is pregnant at approximately 3 months gestation.  Patient presenting today with complaints of dental pain.  She describes severe pain to the left upper third molar that has been worsening over the past several days.  She has tried Orajel, Tylenol, and other medications, but has not helped.  She denies any difficulty breathing or swallowing.  She reports worsening pain when she attempts to eat or drink and when she breathes.  The history is provided by the patient.      Home Medications Prior to Admission medications   Medication Sig Start Date End Date Taking? Authorizing Provider  calcium carbonate (OS-CAL - DOSED IN MG OF ELEMENTAL CALCIUM) 1250 (500 Ca) MG tablet Take 1 tablet (500 mg of elemental calcium total) by mouth daily with lunch. 12/04/20   Cassandria Anger, MD  levothyroxine (SYNTHROID) 100 MCG tablet Take 1 tablet (100 mcg total) by mouth daily before breakfast. 12/04/20   Cassandria Anger, MD      Allergies    Amoxicillin and Penicillins    Review of Systems   Review of Systems  All other systems reviewed and are negative.  Physical Exam Updated Vital Signs BP 124/82 (BP Location: Right Arm)    Pulse 63    Temp 98.3 F (36.8 C) (Oral)    Resp 18    Ht 5\' 7"  (1.702 m) Comment: Simultaneous filing. User may not have seen previous data.   Wt 56.7 kg Comment: Simultaneous filing. User may not have seen previous data.   LMP 12/09/2020 (Approximate)    SpO2 100%    BMI 19.58 kg/m  Physical Exam Vitals and nursing note reviewed.  Constitutional:      General: She is not in acute distress.    Appearance: Normal appearance. She is not ill-appearing.  HENT:     Head:  Normocephalic and atraumatic.     Mouth/Throat:     Mouth: Mucous membranes are moist.     Pharynx: Oropharynx is clear.     Comments: The left upper third molar is tender to the touch.  It is partially impacted with gingival inflammation surrounding the tooth.  There is no obvious abscess.  There is no facial swelling or cervical adenopathy.  There is no trismus and no stridor. Pulmonary:     Effort: Pulmonary effort is normal.  Skin:    General: Skin is warm and dry.  Neurological:     Mental Status: She is alert.    ED Results / Procedures / Treatments   Labs (all labs ordered are listed, but only abnormal results are displayed) Labs Reviewed - No data to display  EKG None  Radiology No results found.  Procedures Procedures    Medications Ordered in ED Medications  clindamycin (CLEOCIN) capsule 300 mg (has no administration in time range)  HYDROcodone-acetaminophen (NORCO/VICODIN) 5-325 MG per tablet 2 tablet (has no administration in time range)    ED Course/ Medical Decision Making/ A&P   Patient presenting here with complaints of dental pain.  She is 3 months pregnant.  She has penicillin allergy so will be treated with clindamycin and hydrocodone.  She is to follow-up with dentistry if  not improving.  Final Clinical Impression(s) / ED Diagnoses Final diagnoses:  None    Rx / DC Orders ED Discharge Orders     None         Veryl Speak, MD 03/11/21 4417    Veryl Speak, MD 03/11/21 [phone]

## 2021-03-11 NOTE — ED Triage Notes (Signed)
Pt c/o left upper dental pain since yesterday. Pt had tried multiple OTC medications with no relief. Pt currently about 3 months pregnant.

## 2021-03-21 ENCOUNTER — Ambulatory Visit
Admission: EM | Admit: 2021-03-21 | Discharge: 2021-03-21 | Disposition: A | Discharge status: Home / Self Care | Payer: Medicaid Other | Attending: Urgent Care | Admitting: Urgent Care

## 2021-03-21 ENCOUNTER — Other Ambulatory Visit: Payer: Self-pay

## 2021-03-21 ENCOUNTER — Encounter: Payer: Self-pay | Admitting: Emergency Medicine

## 2021-03-21 DIAGNOSIS — R0982 Postnasal drip: Secondary | ICD-10-CM | POA: Diagnosis not present

## 2021-03-21 DIAGNOSIS — R070 Pain in throat: Secondary | ICD-10-CM

## 2021-03-21 DIAGNOSIS — O219 Vomiting of pregnancy, unspecified: Secondary | ICD-10-CM

## 2021-03-21 MED ORDER — ONDANSETRON 8 MG PO TBDP
8.0000 mg | ORAL_TABLET | Freq: Once | ORAL | Status: AC
  Administered 2021-03-21: 8 mg via ORAL

## 2021-03-21 MED ORDER — CETIRIZINE HCL 10 MG PO TABS: 10.0000 mg | ORAL_TABLET | Freq: Every day | ORAL | 0 refills | Status: DC

## 2021-03-21 MED ORDER — FLUTICASONE PROPIONATE 50 MCG/ACT NA SUSP: 2.0000 | Freq: Every day | NASAL | 12 refills | Status: DC

## 2021-03-21 MED ORDER — ONDANSETRON 4 MG PO TBDP
4.0000 mg | ORAL_TABLET | Freq: Once | ORAL | Status: DC
Start: 2021-03-21 — End: 2021-03-21

## 2021-03-21 NOTE — ED Provider Notes (Signed)
[place]   MRN: [id number] DOB: Aug 03, 2000  Subjective:   Monique Long is a 21 y.o. female presenting for concerns for an allergic reaction after she ate a plate of flounder and shrimp last night.  Reports that she woke up feeling like she was clawing at her throat and had swelling, itching.  She contacted her OB who advised that she come in today.  She is also had significant nausea and vomiting in pregnancy.  Would like to take medication for this.  Has been using promethazine but is not really helping her.  Denies any chest tightness, shortness of breath, wheezing, facial swelling.  No abdominal pain, vaginal bleeding.  No history of anaphylaxis.  No current facility-administered medications for this encounter.  Current Outpatient Medications:    clindamycin (CLEOCIN) 300 MG capsule, Take 1 capsule (300 mg total) by mouth 4 (four) times daily. X 7 days, Disp: 28 capsule, Rfl: 0   HYDROcodone-acetaminophen (NORCO) 5-325 MG tablet, Take 1-2 tablets by mouth every 6 (six) hours as needed., Disp: 12 tablet, Rfl: 0   levothyroxine (SYNTHROID) 100 MCG tablet, Take 1 tablet (100 mcg total) by mouth daily before breakfast., Disp: 90 tablet, Rfl: 1   calcium carbonate (OS-CAL - DOSED IN MG OF ELEMENTAL CALCIUM) 1250 (500 Ca) MG tablet, Take 1 tablet (500 mg of elemental calcium total) by mouth daily with lunch., Disp: 90 tablet, Rfl: 1   Allergies  Allergen Reactions   Amoxicillin Rash   Penicillins Rash    Past Medical History:  Diagnosis Date   Anxiety    Asthma    Cancer ([place])    Depression    Dysrhythmia    Hypothyroidism    Thyroid disease    Phreesia 02/29/2020     Past Surgical History:  Procedure Laterality Date   NO PAST SURGERIES     THYROIDECTOMY Bilateral 10/09/2020   Procedure: TOTAL THYROIDECTOMY;  Surgeon: Armandina Gemma, MD;  Location: [facility];  Service: General;  Laterality: Bilateral;    Family History  Problem Relation Age of Onset    Hypertension Mother    Heart failure Mother    Cancer Father     Social History   Tobacco Use   Smoking status: Never   Smokeless tobacco: Never  Vaping Use   Vaping Use: Every day   Substances: Nicotine  Substance Use Topics   Alcohol use: No   Drug use: Not Currently    ROS   Objective:   Vitals: BP 111/81 (BP Location: Right Arm)    Pulse 62    Temp 98.6 F (37 C) (Oral)    Resp 18    Ht 5\' 7"  (1.702 m)    Wt 125 lb (56.7 kg)    LMP 12/09/2020 (Approximate)    SpO2 99%    BMI 19.58 kg/m   Physical Exam Constitutional:      General: She is not in acute distress.    Appearance: Normal appearance. She is well-developed and normal weight. She is not ill-appearing, toxic-appearing or diaphoretic.  HENT:     Head: Normocephalic and atraumatic.     Right Ear: Tympanic membrane, ear canal and external ear normal. No drainage or tenderness. No middle ear effusion. There is no impacted cerumen. Tympanic membrane is not erythematous.     Left Ear: Tympanic membrane, ear canal and external ear normal. No drainage or tenderness.  No middle ear effusion. There is no impacted cerumen. Tympanic membrane is not erythematous.  Nose: Nose normal. No congestion or rhinorrhea.     Mouth/Throat:     Mouth: Mucous membranes are moist. No oral lesions.     Pharynx: No pharyngeal swelling, oropharyngeal exudate, posterior oropharyngeal erythema or uvula swelling.     Tonsils: No tonsillar exudate or tonsillar abscesses.     Comments: Significant postnasal drainage overlying pharynx. Eyes:     General: No scleral icterus.       Right eye: No discharge.        Left eye: No discharge.     Extraocular Movements: Extraocular movements intact.     Right eye: Normal extraocular motion.     Left eye: Normal extraocular motion.     Conjunctiva/sclera: Conjunctivae normal.  Cardiovascular:     Rate and Rhythm: Normal rate and regular rhythm.     Pulses: Normal pulses.     Heart sounds: Normal  heart sounds. No murmur heard.   No friction rub. No gallop.  Pulmonary:     Effort: Pulmonary effort is normal. No respiratory distress.     Breath sounds: Normal breath sounds. No stridor. No wheezing, rhonchi or rales.  Musculoskeletal:     Cervical back: Normal range of motion and neck supple.  Lymphadenopathy:     Cervical: No cervical adenopathy.  Skin:    General: Skin is warm and dry.     Findings: No rash.  Neurological:     General: No focal deficit present.     Mental Status: She is alert and oriented to person, place, and time.  Psychiatric:        Mood and Affect: Mood normal.        Behavior: Behavior normal.        Thought Content: Thought content normal.    Assessment and Plan :   PDMP not reviewed this encounter.  1. Post-nasal drainage   2. Nausea and vomiting during pregnancy   3. Throat discomfort     I do not suspect an allergic reaction given physical exam.  Recommended use of Zyrtec and Flonase for her postnasal drainage, likely underlying allergic rhinitis.  She was given Zofran ODT in clinic as she has previously used this and has done well with it.  Recommended follow-up with her OB as soon as possible. Counseled patient on potential for adverse effects with medications prescribed/recommended today, ER and return-to-clinic precautions discussed, patient verbalized understanding.    Jaynee Eagles, PA-C 03/21/21 1306

## 2021-03-21 NOTE — ED Triage Notes (Signed)
Pt reports ate a plate of flounder and shrimp last night and reports since last night emesis and reports at 3am and reports itching in her throat. Pt reports is approximately 3 months pregnant. Pt reports was told by OB to get seen today by ED/UC and then follow up with OB tomorrow.   Pt reports is currently taking promethazine but reports makes pt sleepy rather than helps with nausea.

## 2021-03-27 ENCOUNTER — Ambulatory Visit: Payer: Medicaid Other | Admitting: "Endocrinology

## 2021-03-28 LAB — TSH
TSH: 13.3 — AB (ref 0.41–5.90)
TSH: 13.3 — AB (ref 0.41–5.90)

## 2021-04-04 ENCOUNTER — Encounter: Payer: Self-pay | Admitting: "Endocrinology

## 2021-04-09 ENCOUNTER — Ambulatory Visit (INDEPENDENT_AMBULATORY_CARE_PROVIDER_SITE_OTHER): Payer: Medicaid Other | Admitting: "Endocrinology

## 2021-04-09 ENCOUNTER — Encounter: Payer: Self-pay | Admitting: "Endocrinology

## 2021-04-09 ENCOUNTER — Other Ambulatory Visit: Payer: Self-pay

## 2021-04-09 VITALS — BP 116/56 | HR 76 | Ht 67.0 in | Wt 125.6 lb

## 2021-04-09 DIAGNOSIS — C73 Malignant neoplasm of thyroid gland: Secondary | ICD-10-CM

## 2021-04-09 DIAGNOSIS — E063 Autoimmune thyroiditis: Secondary | ICD-10-CM | POA: Diagnosis not present

## 2021-04-09 DIAGNOSIS — E038 Other specified hypothyroidism: Secondary | ICD-10-CM | POA: Diagnosis not present

## 2021-04-09 MED ORDER — LEVOTHYROXINE SODIUM 100 MCG PO TABS: 100.0000 ug | ORAL_TABLET | Freq: Every day | ORAL | 2 refills | Status: DC

## 2021-04-09 NOTE — Progress Notes (Signed)
04/09/2021, 4:33 PM      Endocrinology follow-up note   Monique Long is a 21 y.o.-year-old female patient being seen in follow-up for postsurgical hypothyroidism.   Past Medical History:  Diagnosis Date   Anxiety    Asthma    Cancer ([place])    Depression    Dysrhythmia    Hypothyroidism    Thyroid disease    Phreesia 02/29/2020    Past Surgical History:  Procedure Laterality Date   NO PAST SURGERIES     THYROIDECTOMY Bilateral 10/09/2020   Procedure: TOTAL THYROIDECTOMY;  Surgeon: Armandina Gemma, MD;  Location: [facility];  Service: General;  Laterality: Bilateral;    Social History   Socioeconomic History   Marital status: Single    Spouse name: Not on file   Number of children: Not on file   Years of education: Not on file   Highest education level: Not on file  Occupational History   Not on file  Tobacco Use   Smoking status: Never   Smokeless tobacco: Never  Vaping Use   Vaping Use: Never used  Substance and Sexual Activity   Alcohol use: No   Drug use: Not Currently   Sexual activity: Not on file  Other Topics Concern   Not on file  Social History Narrative   Not on file   Social Determinants of Health   Financial Resource Strain: Not on file  Food Insecurity: Not on file  Transportation Needs: Not on file  Physical Activity: Not on file  Stress: Not on file  Social Connections: Not on file    Family History  Problem Relation Age of Onset   Hypertension Mother    Heart failure Mother    Cancer Father     Outpatient Encounter Medications as of 04/09/2021  Medication Sig   Prenat w/o A-[id number]-DHA (CITRANATAL 90 DHA) 90-1 & 300 MG MISC Take 1 tablet by mouth daily.   calcium carbonate (OS-CAL - DOSED IN MG OF ELEMENTAL CALCIUM) 1250 (500 Ca) MG tablet Take 1 tablet (500 mg of elemental calcium total) by mouth daily with lunch.  (Patient not taking: Reported on 04/09/2021)   cetirizine (ZYRTEC ALLERGY) 10 MG tablet Take 1 tablet (10 mg total) by mouth daily. (Patient not taking: Reported on 04/09/2021)   clindamycin (CLEOCIN) 300 MG capsule Take 1 capsule (300 mg total) by mouth 4 (four) times daily. X 7 days (Patient not taking: Reported on 04/09/2021)   DICLEGIS 10-10 MG TBEC Take by mouth as needed.   fluticasone (FLONASE) 50 MCG/ACT nasal spray Place 2 sprays into both nostrils daily. (Patient not taking: Reported on 04/09/2021)   HYDROcodone-acetaminophen (NORCO) 5-325 MG tablet Take 1-2 tablets by mouth every 6 (six) hours as needed. (Patient not taking: Reported on 04/09/2021)   levothyroxine (SYNTHROID) 100 MCG tablet Take 1  tablet (100 mcg total) by mouth daily before breakfast.   [DISCONTINUED] levothyroxine (SYNTHROID) 100 MCG tablet Take 1 tablet (100 mcg total) by mouth daily before breakfast.   No facility-administered encounter medications on file as of 04/09/2021.    ALLERGIES: Allergies  Allergen Reactions   Amoxicillin Rash   Penicillins Rash   VACCINATION STATUS: Immunization History  Administered Date(s) Administered   Hepatitis B, ped/adol 01/03/00     HPI    Monique Long  is a patient with the above medical history.  She was determined to have abnormal fine-needle aspiration biopsy suspicious for malignancy in her previous visits.  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   She is currently pregnant at [redacted] weeks of gestation, levothyroxine 100 mcg p.o. daily.  Due to her travel to out of [place], she had an interruption in her thyroid hormone supplement early last month.  Her labs on March 28, 2021 showed high TSH of 30.3.  She has no new complaints.  She deals with morning sickness intermittently.    After her fine-needle aspiration showed atypia of undetermined significance, A sample was sent for Afirma  which confirms approximately 50% risk of malignancy.  Her surgical sample did not show malignancy.   she reports family history of thyroid dysfunction in one of her aunts taking thyroid hormone replacement.  - No family history of thyroid cancer, reports what appears to be head and neck cancer in her father which likely metastasized locally including to his thyroid,  who was a smoker. No history of  radiation therapy to head or neck. No recent use of iodine supplements.   ROS: Limited as above.  Physical Exam: BP (!) 116/56    Pulse 76    Ht 5\' 7"  (1.702 m)    Wt 125 lb 9.6 oz (57 kg)    LMP 12/09/2020 (Approximate)    BMI 19.67 kg/m  Wt Readings from Last 3 Encounters:  04/09/21 125 lb 9.6 oz (57 kg)  03/21/21 125 lb (56.7 kg)  03/11/21 125 lb (56.7 kg)     ASSESSMENT: 1. Hypothyroidism due to Hashimoto's thyroiditis 2.  Hypocalcemia 3.  Pregnant at [redacted] weeks of gestation   PLAN:  Her surgical sample was negative for malignancy despite the fact that Afirma showed 50% risk of malignancy based on atypia of undetermined significance.  She will not need remnant ablation at this time.  For her postsurgical hypothyroidism, and now first trimester pregnancy, she is advised to be consistent and continue levothyroxine 100 mcg p.o. daily before breakfast.   The high TSH is due to the interruption of her levothyroxine when she was out of state.  - We discussed about the correct intake of her thyroid hormone, on empty stomach at fasting, with water, separated by at least 30 minutes from breakfast and other medications,  and separated by more than 4 hours from calcium, iron, multivitamins, acid reflux medications (PPIs). -Patient is made aware of the fact that thyroid hormone replacement is needed for life, dose to be adjusted by periodic monitoring of thyroid function tests.  For her hypocalcemia, she is advised to continue calcium carbonate calcium carbonate 1250 mg p.o. daily at lunch with  plan to repeat calcium along with her next thyroid function tests in 6 months.  She is advised to maintain close follow-up with her OB/GYN provider as well as PCP.    I spent 21 minutes in the care of  the patient today including review of labs from Thyroid Function, CMP, and other relevant labs ; imaging/biopsy records (current and previous including abstractions from other facilities); face-to-face time discussing  her lab results and symptoms, medications doses, her options of short and long term treatment based on the latest standards of care / guidelines;   and documenting the encounter.  Monique Long  participated in the discussions, expressed understanding, and voiced agreement with the above plans.  All questions were answered to her satisfaction. she is encouraged to contact clinic should she have any questions or concerns prior to her return visit.    Return in about 4 weeks (around 05/07/2021) for F/U with Pre-visit Labs.  Glade Lloyd, MD [facility] [address] [place], [place] [address] Phone: [phone]  Fax: [phone]   04/09/2021, 4:33 PM  This note was partially dictated with voice recognition software. Similar sounding words can be transcribed inadequately or may not  be corrected upon review.

## 2021-05-07 ENCOUNTER — Ambulatory Visit: Payer: Medicaid Other | Admitting: "Endocrinology

## 2021-05-10 ENCOUNTER — Encounter: Payer: Self-pay | Admitting: "Endocrinology

## 2021-05-10 ENCOUNTER — Ambulatory Visit (INDEPENDENT_AMBULATORY_CARE_PROVIDER_SITE_OTHER): Payer: Medicaid Other | Admitting: "Endocrinology

## 2021-05-10 VITALS — BP 100/63 | HR 76 | Ht 67.0 in | Wt 133.0 lb

## 2021-05-10 DIAGNOSIS — E890 Postprocedural hypothyroidism: Secondary | ICD-10-CM | POA: Diagnosis not present

## 2021-05-10 LAB — T4, FREE: Free T4: 0.8 ng/dL — ABNORMAL LOW (ref 0.82–1.77)

## 2021-05-10 LAB — TSH
TSH: 83 u[IU]/mL — ABNORMAL HIGH (ref 0.450–4.500)
TSH: 83 — AB (ref 0.41–5.90)

## 2021-05-10 MED ORDER — LEVOTHYROXINE SODIUM 150 MCG PO TABS: 150.0000 ug | ORAL_TABLET | Freq: Every day | ORAL | 1 refills | Status: DC

## 2021-05-10 NOTE — Progress Notes (Signed)
?                                   ?                                         ?                                       05/10/2021, 4:24 PM ? ?    ?Endocrinology follow-up note ? ? ?Monique Long is a 21 y.o.-year-old female patient being seen in follow-up for postsurgical hypothyroidism.   ?Past Medical History:  ?Diagnosis Date  ? Anxiety   ? Asthma   ? Cancer [facility])   ? Depression   ? Dysrhythmia   ? Hypothyroidism   ? Thyroid disease   ? Phreesia 02/29/2020  ? ? ?Past Surgical History:  ?Procedure Laterality Date  ? NO PAST SURGERIES    ? THYROIDECTOMY Bilateral 10/09/2020  ? Procedure: TOTAL THYROIDECTOMY;  Surgeon: Armandina Gemma, MD;  Location: [facility];  Service: General;  Laterality: Bilateral;  ? ? ?Social History  ? ?Socioeconomic History  ? Marital status: Single  ?  Spouse name: Not on file  ? Number of children: Not on file  ? Years of education: Not on file  ? Highest education level: Not on file  ?Occupational History  ? Not on file  ?Tobacco Use  ? Smoking status: Never  ? Smokeless tobacco: Never  ?Vaping Use  ? Vaping Use: Never used  ?Substance and Sexual Activity  ? Alcohol use: No  ? Drug use: Not Currently  ? Sexual activity: Not on file  ?Other Topics Concern  ? Not on file  ?Social History Narrative  ? Not on file  ? ?Social Determinants of Health  ? ?Financial Resource Strain: Not on file  ?Food Insecurity: Not on file  ?Transportation Needs: Not on file  ?Physical Activity: Not on file  ?Stress: Not on file  ?Social Connections: Not on file  ? ? ?Family History  ?Problem Relation Age of Onset  ? Hypertension Mother   ? Heart failure Mother   ? Cancer Father   ? ? ?Outpatient Encounter Medications as of 05/10/2021  ?Medication Sig  ? Prenatal Vit-Fe Fumarate-FA (PRENATAL VITAMIN PO) Take 1 tablet by mouth every other day.  ? levothyroxine (SYNTHROID) 150 MCG tablet Take 1 tablet (150 mcg total) by mouth daily before breakfast.  ? [DISCONTINUED] calcium carbonate (OS-CAL - DOSED IN MG OF ELEMENTAL  CALCIUM) 1250 (500 Ca) MG tablet Take 1 tablet (500 mg of elemental calcium total) by mouth daily with lunch. (Patient not taking: Reported on 04/09/2021)  ? [DISCONTINUED] cetirizine (ZYRTEC ALLERGY) 10 MG tablet Take 1 tablet (10 mg total) by mouth daily. (Patient not taking: Reported on 04/09/2021)  ? [DISCONTINUED] clindamycin (CLEOCIN) 300 MG capsule Take 1 capsule (300 mg total) by mouth 4 (four) times daily. X 7 days (Patient not taking: Reported on 04/09/2021)  ? [DISCONTINUED] DICLEGIS 10-10 MG TBEC Take by mouth as needed.  ? [DISCONTINUED] fluticasone (FLONASE) 50 MCG/ACT nasal spray Place 2 sprays into both nostrils daily. (Patient not taking: Reported on 04/09/2021)  ? [DISCONTINUED] HYDROcodone-acetaminophen (NORCO) 5-325 MG tablet Take 1-2 tablets by mouth  every 6 (six) hours as needed. (Patient not taking: Reported on 04/09/2021)  ? [DISCONTINUED] levothyroxine (SYNTHROID) 100 MCG tablet Take 1 tablet (100 mcg total) by mouth daily before breakfast.  ? ?No facility-administered encounter medications on file as of 05/10/2021.  ? ? ?ALLERGIES: ?Allergies  ?Allergen Reactions  ? Amoxicillin Rash  ? Penicillins Rash  ? ?VACCINATION STATUS: ?Immunization History  ?Administered Date(s) Administered  ? Hepatitis B, ped/adol 1999/05/14  ? ? ? ?HPI  ? ? ?Monique Long  is a patient with the above medical history. ? ?She was determined to have abnormal fine-needle aspiration biopsy suspicious for malignancy in her previous visits.  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   ?She is currently pregnant at [redacted] weeks of gestation, levothyroxine 100 mcg p.o. daily.  She reports consistency taking her medication.  Her previsit labs are consistent with under replacement.   ?She complains of poor appetite and fatigue.   ?After her fine-needle aspiration showed atypia of undetermined significance, A sample was sent for Afirma which confirms  approximately 50% risk of malignancy.  Her surgical sample did not show malignancy. ? ? ?she reports family history of thyroid dysfunction in one of her aunts taking thyroid hormone replacement.  ?- No family history of thyroid cancer, reports what appears to be head and neck cancer in her father which likely metastasized locally including to his thyroid,  who was a smoker. ?No history of  radiation therapy to head or neck. ?No recent use of iodine supplements. ? ? ?ROS: ?Limited as above. ? ?Physical Exam: ?BP 100/63   Pulse 76   Ht 5\' 7"  (1.702 m)   Wt 133 lb (60.3 kg)   LMP 12/09/2020 (Approximate)   BMI 20.83 kg/m?  ?Wt Readings from Last 3 Encounters:  ?05/10/21 133 lb (60.3 kg)  ?04/09/21 125 lb 9.6 oz (57 kg)  ?03/21/21 125 lb (56.7 kg)  ? ? ? ?ASSESSMENT: ?1. Hypothyroidism due to Hashimoto's thyroiditis ?2.  Hypocalcemia ?3.  Pregnant at [redacted] weeks of gestation ? ? ?PLAN:  ?Her surgical sample was negative for malignancy despite the fact that Afirma showed 50% risk of malignancy based on atypia of undetermined significance.  She will not need remnant ablation at this time. ? ?For her postsurgical hypothyroidism, and now second trimester pregnancy, she is advised to be consistent taking her levothyroxine.  I discussed and increase the dose to 150 mcg p.o. daily before breakfast.   ? ? - We discussed about the correct intake of her thyroid hormone, on empty stomach at fasting, with water, separated by at least 30 minutes from breakfast and other medications,  and separated by more than 4 hours from calcium, iron, multivitamins, acid reflux medications (PPIs). ?-Patient is made aware of the fact that thyroid hormone replacement is needed for life, dose to be adjusted by periodic monitoring of thyroid function tests. ? ? ?For her hypocalcemia, she is advised to continue calcium carbonate calcium carbonate 1250 mg p.o. daily at lunch with plan to repeat calcium along with her next thyroid function tests in 6  months. ? ?She is advised to maintain close follow-up with her OB/GYN provider as well as PCP. ? ? ?I spent 21 minutes in the care of the patient today including review of labs from Thyroid Function, CMP, and other relevant labs ; imaging/biopsy records (current and previous including abstractions from other facilities); face-to-face time discussing  her lab results and symptoms,  medications doses, her options of short and long term treatment based on the latest standards of care / guidelines;   and documenting the encounter. ? ?Holiday  participated in the discussions, expressed understanding, and voiced agreement with the above plans.  All questions were answered to her satisfaction. she is encouraged to contact clinic should she have any questions or concerns prior to her return visit. ? ? ? ? ?Return in about 5 weeks (around 06/14/2021) for F/U with Pre-visit Labs. ? ?Glade Lloyd, MD ?[place] ?[facility] ?[address] ?[place], [place] [address] ?Phone: [phone]  Fax: [phone]  ? ?05/10/2021, 4:24 PM ? ?This note was partially dictated with voice recognition software. Similar sounding words can be transcribed inadequately or may not  be corrected upon review. ?

## 2021-05-14 ENCOUNTER — Ambulatory Visit
Admission: EM | Admit: 2021-05-14 | Discharge: 2021-05-14 | Disposition: A | Discharge status: Other Acute Inpatient Hospital | Payer: Medicaid Other

## 2021-05-14 ENCOUNTER — Other Ambulatory Visit: Payer: Self-pay

## 2021-05-14 ENCOUNTER — Emergency Department (HOSPITAL_COMMUNITY)
Admission: EM | Admit: 2021-05-14 | Discharge: 2021-05-14 | Disposition: A | Discharge status: Home / Self Care | Payer: Medicaid Other | Attending: Emergency Medicine | Admitting: Emergency Medicine

## 2021-05-14 ENCOUNTER — Emergency Department (HOSPITAL_COMMUNITY): Payer: Medicaid Other

## 2021-05-14 ENCOUNTER — Encounter (HOSPITAL_COMMUNITY): Payer: Self-pay | Admitting: *Deleted

## 2021-05-14 DIAGNOSIS — O26892 Other specified pregnancy related conditions, second trimester: Secondary | ICD-10-CM | POA: Insufficient documentation

## 2021-05-14 DIAGNOSIS — O99012 Anemia complicating pregnancy, second trimester: Secondary | ICD-10-CM | POA: Diagnosis not present

## 2021-05-14 DIAGNOSIS — E871 Hypo-osmolality and hyponatremia: Secondary | ICD-10-CM | POA: Diagnosis not present

## 2021-05-14 DIAGNOSIS — N939 Abnormal uterine and vaginal bleeding, unspecified: Secondary | ICD-10-CM

## 2021-05-14 DIAGNOSIS — Z3A16 16 weeks gestation of pregnancy: Secondary | ICD-10-CM | POA: Diagnosis not present

## 2021-05-14 DIAGNOSIS — Z85850 Personal history of malignant neoplasm of thyroid: Secondary | ICD-10-CM | POA: Insufficient documentation

## 2021-05-14 DIAGNOSIS — N898 Other specified noninflammatory disorders of vagina: Secondary | ICD-10-CM | POA: Diagnosis not present

## 2021-05-14 LAB — CBC WITH DIFFERENTIAL/PLATELET
Abs Immature Granulocytes: 0.03 10*3/uL (ref 0.00–0.07)
Basophils Absolute: 0 10*3/uL (ref 0.0–0.1)
Basophils Relative: 0 %
Eosinophils Absolute: 0 10*3/uL (ref 0.0–0.5)
Eosinophils Relative: 1 %
HCT: 34.8 % — ABNORMAL LOW (ref 36.0–46.0)
Hemoglobin: 11.4 g/dL — ABNORMAL LOW (ref 12.0–15.0)
Immature Granulocytes: 0 %
Lymphocytes Relative: 11 %
Lymphs Abs: 0.9 10*3/uL (ref 0.7–4.0)
MCH: 30.3 pg (ref 26.0–34.0)
MCHC: 32.8 g/dL (ref 30.0–36.0)
MCV: 92.6 fL (ref 80.0–100.0)
Monocytes Absolute: 0.4 10*3/uL (ref 0.1–1.0)
Monocytes Relative: 5 %
Neutro Abs: 7.2 10*3/uL (ref 1.7–7.7)
Neutrophils Relative %: 83 %
Platelets: 204 10*3/uL (ref 150–400)
RBC: 3.76 MIL/uL — ABNORMAL LOW (ref 3.87–5.11)
RDW: 13.3 % (ref 11.5–15.5)
WBC: 8.6 10*3/uL (ref 4.0–10.5)
nRBC: 0 % (ref 0.0–0.2)

## 2021-05-14 LAB — URINALYSIS, ROUTINE W REFLEX MICROSCOPIC
Bilirubin Urine: NEGATIVE
Glucose, UA: NEGATIVE mg/dL
Hgb urine dipstick: NEGATIVE
Ketones, ur: NEGATIVE mg/dL
Leukocytes,Ua: NEGATIVE
Nitrite: NEGATIVE
Protein, ur: NEGATIVE mg/dL
Specific Gravity, Urine: 1.015 (ref 1.005–1.030)
pH: 8 (ref 5.0–8.0)

## 2021-05-14 LAB — BASIC METABOLIC PANEL
Anion gap: 7 (ref 5–15)
BUN: 12 mg/dL (ref 6–20)
CO2: 25 mmol/L (ref 22–32)
Calcium: 8.1 mg/dL — ABNORMAL LOW (ref 8.9–10.3)
Chloride: 101 mmol/L (ref 98–111)
Creatinine, Ser: 0.44 mg/dL (ref 0.44–1.00)
GFR, Estimated: >60 mL/min (ref >60)
Glucose, Bld: 83 mg/dL (ref 70–99)
Potassium: 3.8 mmol/L (ref 3.5–5.1)
Sodium: 133 mmol/L — ABNORMAL LOW (ref 135–145)

## 2021-05-14 LAB — HCG, QUANTITATIVE, PREGNANCY: hCG, Beta Chain, Quant, S: 114621 m[IU]/mL — ABNORMAL HIGH (ref <5)

## 2021-05-14 LAB — ABO/RH: ABO/RH(D): A POS

## 2021-05-14 MED ORDER — ACETAMINOPHEN 325 MG PO TABS
650.0000 mg | ORAL_TABLET | Freq: Once | ORAL | Status: AC
  Administered 2021-05-14: 650 mg via ORAL
  Filled 2021-05-14: qty 2

## 2021-05-14 NOTE — ED Provider Notes (Signed)
?[facility] ?Provider Note ? ? ?CSN: [id number] ?Arrival date & time: 05/14/21  1050 ? ?  ? ?History ?Chief Complaint  ?Patient presents with  ? Vaginal Discharge  ? ? ?Monique Long is a 21 y.o. female who is G 4P1 AB 2 and with history of thyroid cancer who presents to the emergency department with vaginal spotting characterizes pink-tinged and bloody that started yesterday.  Patient is approximately 15 weeks by dates pregnant.  She was having intermittent lower pelvic pain.  Pain-free currently.  No nausea or vomiting.  Patient has been trying to schedule an OB appointment but has not had any prenatal care yet. ? ? ?Vaginal Discharge ? ?  ? ?Home Medications ?Prior to Admission medications   ?Medication Sig Start Date End Date Taking? Authorizing Provider  ?levothyroxine (SYNTHROID) 150 MCG tablet Take 1 tablet (150 mcg total) by mouth daily before breakfast. 05/10/21   Nida, Marella Chimes, MD  ?Prenatal Vit-Fe Fumarate-FA (PRENATAL VITAMIN PO) Take 1 tablet by mouth every other day.    [provider]  ?   ? ?Allergies    ?Amoxicillin and Penicillins   ? ?Review of Systems   ?Review of Systems  ?Genitourinary:  Positive for vaginal discharge.  ?All other systems reviewed and are negative. ? ?Physical Exam ?Updated Vital Signs ?BP (!) 92/52   Pulse 69   Temp 98.2 ?F (36.8 ?C) (Oral)   Resp 20   Ht '5\' 6"'$  (1.676 m)   Wt 60.4 kg   LMP 12/09/2020 (Approximate)   SpO2 100%   BMI 21.49 kg/m?  ?Physical Exam ?Vitals and nursing note reviewed.  ?Constitutional:   ?   General: She is not in acute distress. ?   Appearance: Normal appearance.  ?HENT:  ?   Head: Normocephalic and atraumatic.  ?Eyes:  ?   General:     ?   Right eye: No discharge.     ?   Left eye: No discharge.  ?Cardiovascular:  ?   Comments: Regular rate and rhythm.  S1/S2 are distinct without any evidence of murmur, rubs, or gallops. No evidence of pedal edema. ?Pulmonary:  ?   Comments: Clear to auscultation bilaterally.   Normal effort.  No respiratory distress.  No evidence of wheezes, rales, or rhonchi heard throughout. ?Abdominal:  ?   General: Abdomen is flat. Bowel sounds are normal. There is no distension.  ?   Tenderness: There is no abdominal tenderness. There is no guarding or rebound.  ?   Comments: Gravid uterus below umbilicus.  ?Musculoskeletal:     ?   General: Normal range of motion.  ?   Cervical back: Neck supple.  ?Skin: ?   General: Skin is warm and dry.  ?   Findings: No rash.  ?Neurological:  ?   General: No focal deficit present.  ?   Mental Status: She is alert.  ?Psychiatric:     ?   Mood and Affect: Mood normal.     ?   Behavior: Behavior normal.  ? ? ?ED Results / Procedures / Treatments   ?Labs ?(all labs ordered are listed, but only abnormal results are displayed) ?Labs Reviewed  ?CBC WITH DIFFERENTIAL/PLATELET - Abnormal; Notable for the following components:  ?    Result Value  ? RBC 3.76 (*)   ? Hemoglobin 11.4 (*)   ? HCT 34.8 (*)   ? All other components within normal limits  ?BASIC METABOLIC PANEL - Abnormal; Notable for the  following components:  ? Sodium 133 (*)   ? Calcium 8.1 (*)   ? All other components within normal limits  ?HCG, QUANTITATIVE, PREGNANCY - Abnormal; Notable for the following components:  ? hCG, Beta Neomia Dear 329,924 (*)   ? All other components within normal limits  ?URINALYSIS, ROUTINE W REFLEX MICROSCOPIC - Abnormal; Notable for the following components:  ? APPearance HAZY (*)   ? All other components within normal limits  ?ABO/RH  ? ? ?EKG ?None ? ?Radiology ?US OB Limited ? ?Result Date: 05/14/2021 ?CLINICAL DATA:  Decreased fetal movement EXAM: LIMITED OBSTETRIC ULTRASOUND COMPARISON:  None. FINDINGS: Number of Fetuses: 1 Heart Rate:  143 bpm Movement: Yes Presentation: Transverse, head maternal right Placental Location: Posterior-right lateral Previa: No Amniotic Fluid (Subjective):  Within normal limits. Maximum vertical pocket: 5.0 cm BPD: 3.5 cm 16 w  5 d MATERNAL  FINDINGS: Cervix:  Appears closed. Uterus/Adnexae: No abnormality visualized. IMPRESSION: Single live intrauterine gestation in transverse presentation. This exam is performed on an emergent basis and does not comprehensively evaluate fetal size, dating, or anatomy; follow-up complete OB US should be considered if further fetal assessment is warranted. Electronically Signed   By: Davina Poke D.O.   On: 05/14/2021 12:55   ? ?Procedures ?Procedures  ? ? ?Medications Ordered in ED ?Medications  ?acetaminophen (TYLENOL) tablet 650 mg (650 mg Oral Given 05/14/21 1609)  ? ? ?ED Course/ Medical Decision Making/ A&P ?  ? ?                        ?Medical Decision Making ?Amount and/or Complexity of Data Reviewed ?Labs: ordered. Decision-making details documented in ED Course. ?Radiology: ordered. ? ?Risk ?OTC drugs. ? ? ?This patient presents to the ED for concern of vaginal bleeding in pregnancy, this involves an extensive number of treatment options, and is a complaint that carries with it a high risk of complications and morbidity.  The differential diagnosis includes ectopic, threatened miscarriage, ovarian cyst. ? ? ?Co morbidities that complicate the patient evaluation ? ?Thyroid cancer ? ? ?Additional history obtained: ? ?Additional history obtained from nursing note and old records ?External records from outside source obtained and reviewed including OB/GYN notes from 04/25/2021 for prenatal care visit.  At that time good fetal tones were heard and there was no evidence of vaginal bleeding or leakage of fluid at that time.  She was scheduled follow-up for 05/23/2021. ? ? ?Lab Tests: ? ?I Ordered, and personally interpreted labs.  The pertinent results include: CBC reveals mild anemia which seems to be new for the patient.  Likely physiologic given her pregnancy state and hypervolemia.  Urinalysis was without any signs of infection.  BMP showed mild hyponatremia.  Serum hCG confirmed pregnancy around [redacted] weeks  gestation.  Patient is A+ and does not need RhoGAM at this time. ? ? ?Imaging Studies ordered: ? ?I ordered imaging studies including US OB ?I independently visualized and interpreted imaging which showed live intrauterine gestational pregnancy. ?I agree with the radiologist interpretation ? ? ?Cardiac Monitoring: ? ?The patient was maintained on a cardiac monitor.  I personally viewed and interpreted the cardiac monitored which showed an underlying rhythm of: Normal sinus rhythm ? ? ?Medicines ordered and prescription drug management: ? ?I ordered medication including Tylenol for headache ?Reevaluation of the patient after these medicines showed that the patient improved ?I have reviewed the patients home medicines and have made adjustments as needed ? ? ?Problem List /  ED Course: ? ?Vaginal bleeding in pregnancy.  Serum quant revealed approximately [redacted] weeks gestation.  Ultrasound did show live intrauterine gestation in good position.  Patient is a positive and not need of RhoGAM at this time.  Patient has had no vaginal bleeding that has been in department.  The rest of her labs are reassuring.  Patient wishes to go home.  I think this is reasonable.  I will have her follow-up with her OB/GYN for further evaluation. Strict return precautions given.  ? ? ?Reevaluation: ? ?After the interventions noted above, I reevaluated the patient and found that they have :improved ? ? ?Dispostion: ? ?After consideration of the diagnostic results and the patients response to treatment, I feel that the patent would benefit from outpatient follow up with her OBGYN. ? ?Final Clinical Impression(s) / ED Diagnoses ?Final diagnoses:  ?Vaginal bleeding  ? ? ?Rx / DC Orders ?ED Discharge Orders   ? ? None  ? ?  ? ? ?  ?Myna Bright [place], PA-C ?05/14/21 1639 ? ?  ?Milton Ferguson, MD ?05/15/21 1720 ? ?

## 2021-05-14 NOTE — ED Triage Notes (Signed)
States she wants an ultrasound due to decreased fetal movement, also c/o pink tinged vaginal discharge without odor ?

## 2021-05-14 NOTE — Discharge Instructions (Signed)
Please follow-up with your OB/GYN for further evaluation.  Return to the emergency department for any worsening symptoms. ?

## 2021-05-30 ENCOUNTER — Emergency Department (HOSPITAL_COMMUNITY)
Admission: EM | Admit: 2021-05-30 | Discharge: 2021-05-31 | Disposition: A | Discharge status: Home / Self Care | Payer: Medicaid Other | Attending: Emergency Medicine | Admitting: Emergency Medicine

## 2021-05-30 ENCOUNTER — Encounter (HOSPITAL_COMMUNITY): Payer: Self-pay | Admitting: Emergency Medicine

## 2021-05-30 ENCOUNTER — Other Ambulatory Visit: Payer: Self-pay

## 2021-05-30 DIAGNOSIS — Z3A20 20 weeks gestation of pregnancy: Secondary | ICD-10-CM | POA: Diagnosis not present

## 2021-05-30 DIAGNOSIS — O2692 Pregnancy related conditions, unspecified, second trimester: Secondary | ICD-10-CM | POA: Diagnosis not present

## 2021-05-30 DIAGNOSIS — E039 Hypothyroidism, unspecified: Secondary | ICD-10-CM | POA: Diagnosis not present

## 2021-05-30 DIAGNOSIS — O219 Vomiting of pregnancy, unspecified: Secondary | ICD-10-CM | POA: Diagnosis present

## 2021-05-30 DIAGNOSIS — Z79899 Other long term (current) drug therapy: Secondary | ICD-10-CM | POA: Diagnosis not present

## 2021-05-30 DIAGNOSIS — J45909 Unspecified asthma, uncomplicated: Secondary | ICD-10-CM | POA: Diagnosis not present

## 2021-05-30 DIAGNOSIS — Z20822 Contact with and (suspected) exposure to covid-19: Secondary | ICD-10-CM | POA: Insufficient documentation

## 2021-05-30 DIAGNOSIS — R197 Diarrhea, unspecified: Secondary | ICD-10-CM | POA: Insufficient documentation

## 2021-05-30 DIAGNOSIS — R112 Nausea with vomiting, unspecified: Secondary | ICD-10-CM

## 2021-05-30 LAB — CBC WITH DIFFERENTIAL/PLATELET
Abs Immature Granulocytes: 0.05 10*3/uL (ref 0.00–0.07)
Basophils Absolute: 0 10*3/uL (ref 0.0–0.1)
Basophils Relative: 0 %
Eosinophils Absolute: 0.1 10*3/uL (ref 0.0–0.5)
Eosinophils Relative: 1 %
HCT: 35.2 % — ABNORMAL LOW (ref 36.0–46.0)
Hemoglobin: 12.2 g/dL (ref 12.0–15.0)
Immature Granulocytes: 1 %
Lymphocytes Relative: 5 %
Lymphs Abs: 0.4 10*3/uL — ABNORMAL LOW (ref 0.7–4.0)
MCH: 32.2 pg (ref 26.0–34.0)
MCHC: 34.7 g/dL (ref 30.0–36.0)
MCV: 92.9 fL (ref 80.0–100.0)
Monocytes Absolute: 0.4 10*3/uL (ref 0.1–1.0)
Monocytes Relative: 4 %
Neutro Abs: 8.3 10*3/uL — ABNORMAL HIGH (ref 1.7–7.7)
Neutrophils Relative %: 89 %
Platelets: 220 10*3/uL (ref 150–400)
RBC: 3.79 MIL/uL — ABNORMAL LOW (ref 3.87–5.11)
RDW: 13.6 % (ref 11.5–15.5)
WBC: 9.2 10*3/uL (ref 4.0–10.5)
nRBC: 0 % (ref 0.0–0.2)

## 2021-05-30 LAB — URINALYSIS, ROUTINE W REFLEX MICROSCOPIC
Bilirubin Urine: NEGATIVE
Glucose, UA: NEGATIVE mg/dL
Hgb urine dipstick: NEGATIVE
Ketones, ur: 80 mg/dL — AB
Leukocytes,Ua: NEGATIVE
Nitrite: NEGATIVE
Protein, ur: 100 mg/dL — AB
Specific Gravity, Urine: 1.027 (ref 1.005–1.030)
pH: 5 (ref 5.0–8.0)

## 2021-05-30 LAB — COMPREHENSIVE METABOLIC PANEL
ALT: 14 U/L (ref 0–44)
AST: 18 U/L (ref 15–41)
Albumin: 4 g/dL (ref 3.5–5.0)
Alkaline Phosphatase: 53 U/L (ref 38–126)
Anion gap: 11 (ref 5–15)
BUN: 12 mg/dL (ref 6–20)
CO2: 22 mmol/L (ref 22–32)
Calcium: 8.6 mg/dL — ABNORMAL LOW (ref 8.9–10.3)
Chloride: 103 mmol/L (ref 98–111)
Creatinine, Ser: 0.67 mg/dL (ref 0.44–1.00)
GFR, Estimated: >60 mL/min (ref >60)
Glucose, Bld: 104 mg/dL — ABNORMAL HIGH (ref 70–99)
Potassium: 3.5 mmol/L (ref 3.5–5.1)
Sodium: 136 mmol/L (ref 135–145)
Total Bilirubin: 0.7 mg/dL (ref 0.3–1.2)
Total Protein: 7.2 g/dL (ref 6.5–8.1)

## 2021-05-30 LAB — RESP PANEL BY RT-PCR (FLU A&B, COVID) ARPGX2
Influenza A by PCR: NEGATIVE
Influenza B by PCR: NEGATIVE
SARS Coronavirus 2 by RT PCR: NEGATIVE

## 2021-05-30 LAB — LIPASE, BLOOD: Lipase: 38 U/L (ref 11–51)

## 2021-05-30 MED ORDER — SODIUM CHLORIDE 0.9 % IV BOLUS
1000.0000 mL | Freq: Once | INTRAVENOUS | Status: AC
  Administered 2021-05-30: 1000 mL via INTRAVENOUS

## 2021-05-30 MED ORDER — ONDANSETRON HCL 4 MG/2ML IJ SOLN
4.0000 mg | Freq: Once | INTRAMUSCULAR | Status: AC
  Administered 2021-05-30: 4 mg via INTRAVENOUS
  Filled 2021-05-30: qty 2

## 2021-05-30 NOTE — ED Notes (Signed)
Pt refused fetal monitoring until she has stopped throwing up. ?

## 2021-05-30 NOTE — ED Triage Notes (Signed)
Pt reports n/v/d since 1000; denies abd pain, fever; pt reports she is [redacted] weeks pregnant  ?

## 2021-05-30 NOTE — ED Provider Notes (Signed)
?[facility] ?Provider Note ? ? ?CSN: [id number] ?Arrival date & time: 05/30/21  2104 ? ?  ? ?History ? ?Chief Complaint  ?Patient presents with  ? Emesis  ? ? ?Monique Long is a 21 y.o. female. ? ?21 year old female presents with complaint of nausea, vomiting, diarrhea onset today.  Patient is [redacted] weeks pregnant with routine prenatal care, no complications with pregnancy thus far.  She reports normal fetal activity today, denies bleeding or vaginal discharge, leaking of fluids.  No known sick contacts, no recent travel, no recent antibiotics.  Denies abdominal pain, blood in emesis or stools or dysuria. 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 ? ? ?  ? ?Home Medications ?Prior to Admission medications   ?Medication Sig Start Date End Date Taking? Authorizing Provider  ?levothyroxine (SYNTHROID) 150 MCG tablet Take 1 tablet (150 mcg total) by mouth daily before breakfast. 05/10/21   Nida, Marella Chimes, MD  ?Prenatal Vit-Fe Fumarate-FA (PRENATAL VITAMIN PO) Take 1 tablet by mouth every other day.    [provider]  ?   ? ?Allergies    ?Amoxicillin and Penicillins   ? ?Review of Systems   ?Review of Systems ?Negative except as per HPI ?Physical Exam ?Updated Vital Signs ?BP 114/72 (BP Location: Right Arm)   Pulse 93   Temp 97.7 ?F (36.5 ?C) (Oral)   Resp 16   Ht '5\' 6"'$  (1.676 m)   Wt 60.3 kg   LMP 12/09/2020 (Approximate)   SpO2 100%   BMI 21.47 kg/m?  ?Physical Exam ?Vitals and nursing note reviewed.  ?Constitutional:   ?   General: She is not in acute distress. ?   Appearance: She is well-developed. She is not diaphoretic.  ?HENT:  ?   Head: Normocephalic and atraumatic.  ?   Mouth/Throat:  ?   Mouth: Mucous membranes are moist.  ?Eyes:  ?   Conjunctiva/sclera: Conjunctivae normal.  ?Cardiovascular:  ?   Rate and Rhythm: Normal rate and regular rhythm.  ?   Pulses: Normal pulses.  ?   Heart sounds: Normal heart  sounds.  ?Pulmonary:  ?   Effort: Pulmonary effort is normal.  ?   Breath sounds: Normal breath sounds.  ?Abdominal:  ?   Palpations: Abdomen is soft.  ?   Tenderness: There is no abdominal tenderness.  ?   Comments: Gravid  ?Musculoskeletal:  ?   Right lower leg: No edema.  ?   Left lower leg: No edema.  ?Skin: ?   General: Skin is warm and dry.  ?Neurological:  ?   Mental Status: She is alert and oriented to person, place, and time.  ?Psychiatric:     ?   Behavior: Behavior normal.  ? ? ?ED Results / Procedures / Treatments   ?Labs ?(all labs ordered are listed, but only abnormal results are displayed) ?Labs Reviewed  ?CBC WITH DIFFERENTIAL/PLATELET - Abnormal; Notable for the following components:  ?    Result Value  ? RBC 3.79 (*)   ? HCT 35.2 (*)   ? Neutro Abs 8.3 (*)   ? Lymphs Abs 0.4 (*)   ? All other components within normal limits  ?COMPREHENSIVE METABOLIC PANEL - Abnormal; Notable for the following components:  ? Glucose, Bld 104 (*)   ? Calcium 8.6 (*)   ? All other components within normal limits  ?RESP PANEL BY RT-PCR (FLU A&B, COVID) ARPGX2  ?LIPASE, BLOOD  ?URINALYSIS, ROUTINE W REFLEX MICROSCOPIC  ? ? ?  EKG ?None ? ?Radiology ?No results found. ? ?Procedures ?Procedures  ? ? ?Medications Ordered in ED ?Medications  ?sodium chloride 0.9 % bolus 1,000 mL (0 mLs Intravenous Stopped 05/30/21 2311)  ?ondansetron [facility]) injection 4 mg (4 mg Intravenous Given 05/30/21 2205)  ? ? ?ED Course/ Medical Decision Making/ A&P ?  ?                        ?Medical Decision Making ?Amount and/or Complexity of Data Reviewed ?Labs: ordered. ? ?Risk ?Prescription drug management. ? ? ?This patient presents to the ED for concern of nausea vomiting, diarrhea at [redacted] weeks gestation without abdominal pain, fevers, urinary symptoms, this involves an extensive number of treatment options, and is a complaint that carries with it a high risk of complications and morbidity.  The differential diagnosis includes but not limited to  gastroenteritis, pyelonephritis, appendicitis, colitis, enteritis  ? ? ?Co morbidities that complicate the patient evaluation ? ?Pregnant, hypothyroid  ? ? ?Additional history obtained: ? ?External records from outside source obtained and reviewed including recent routine prenatal visit dated 05/23/2021 ? ? ?Lab Tests: ? ?I Ordered, and personally interpreted labs.  The pertinent results include: CBC with normal white blood cell count.  CMP without significant Electra derangement, negative for COVID and flu, lipase normal. ?Pending urinalysis at time of signout oncoming provider.   ? ? ?Problem List / ED Course / Critical interventions / Medication management ? ?21 year old female presents at [redacted] weeks gestation with complaint of nausea, vomiting, diarrhea without abdominal pain.  Abdomen is nontender.  Patient is given IV fluids, labs assessed. ?I ordered medication including IV fluids and Zofran for vomiting ?Reevaluation of the patient after these medicines showed that the patient improved ?I have reviewed the patients home medicines and have made adjustments as needed ? ? ? ? ? ? ? ? ?Final Clinical Impression(s) / ED Diagnoses ?Final diagnoses:  ?Nausea vomiting and diarrhea  ?[redacted] weeks gestation of pregnancy  ? ? ?Rx / DC Orders ?ED Discharge Orders   ? ? None  ? ?  ? ? ?  ?Tacy Learn, PA-C ?05/30/21 2313 ? ?  ?Davonna Belling, MD ?05/30/21 2338 ? ?

## 2021-05-30 NOTE — ED Notes (Signed)
Successful fluid challenge. ?

## 2021-05-31 MED ORDER — ONDANSETRON 4 MG PO TBDP: 4.0000 mg | ORAL_TABLET | Freq: Three times a day (TID) | ORAL | 0 refills | Status: DC | PRN

## 2021-05-31 MED ORDER — ONDANSETRON HCL 4 MG/2ML IJ SOLN
4.0000 mg | Freq: Once | INTRAMUSCULAR | Status: AC
  Administered 2021-05-31: 4 mg via INTRAVENOUS
  Filled 2021-05-31: qty 2

## 2021-05-31 NOTE — ED Provider Notes (Signed)
Care of the patient assumed at the change of shift pending UA. Here with vomiting in pregnancy. Has Phenergan at home but it makes her drowsy.  ?Physical Exam  ?BP 114/72 (BP Location: Right Arm)   Pulse 93   Temp 97.7 ?F (36.5 ?C) (Oral)   Resp 16   Ht '5\' 6"'$  (1.676 m)   Wt 60.3 kg   LMP 12/09/2020 (Approximate)   SpO2 100%   BMI 21.47 kg/m?  ? ?Physical Exam ? ?Procedures  ?Procedures ? ?ED Course / MDM  ?  ?Medical Decision Making ?Amount and/or Complexity of Data Reviewed ?Labs: ordered. ? ?Risk ?Prescription drug management. ? ? ?Patient is feeling better. UA with ketones and protein but no signs of infection. She has been tolerating PO fluids and would like to go home. Recommend close Ob/Gyn follow up.  ? ? ? ? ?  ?Truddie Hidden, MD ?05/31/21 0028 ? ?

## 2021-06-04 ENCOUNTER — Ambulatory Visit: Payer: Medicaid Other | Admitting: "Endocrinology

## 2021-06-14 ENCOUNTER — Ambulatory Visit: Payer: Medicaid Other | Admitting: "Endocrinology

## 2021-07-03 ENCOUNTER — Ambulatory Visit (INDEPENDENT_AMBULATORY_CARE_PROVIDER_SITE_OTHER): Payer: Medicaid Other | Admitting: Internal Medicine

## 2021-07-03 ENCOUNTER — Encounter: Payer: Self-pay | Admitting: Internal Medicine

## 2021-07-03 DIAGNOSIS — F321 Major depressive disorder, single episode, moderate: Secondary | ICD-10-CM | POA: Diagnosis not present

## 2021-07-03 DIAGNOSIS — Z3A23 23 weeks gestation of pregnancy: Secondary | ICD-10-CM | POA: Diagnosis not present

## 2021-07-03 DIAGNOSIS — F4321 Adjustment disorder with depressed mood: Secondary | ICD-10-CM

## 2021-07-03 NOTE — Progress Notes (Signed)
?  ? ?Virtual Visit via Telephone Note  ? ?This visit type was conducted due to national recommendations for restrictions regarding the COVID-19 Pandemic (e.g. social distancing) in an effort to limit this patient's exposure and mitigate transmission in our community.  Due to her co-morbid illnesses, this patient is at least at moderate risk for complications without adequate follow up.  This format is felt to be most appropriate for this patient at this time.  The patient did not have access to video technology/had technical difficulties with video requiring transitioning to audio format only (telephone).  All issues noted in this document were discussed and addressed.  No physical exam could be performed with this format. ? ?Evaluation Performed:  Follow-up visit ? ?Date:  07/03/2021  ? ?ID:  Monique Long, DOB 2000/08/15, MRN [id number] ? ?Patient Location: Home ?Provider Location: Office/Clinic ? ?Participants: Patient ?Location of Patient: Home ?Location of Provider: Telehealth ?Consent was obtain for visit to be over via telehealth. ?I verified that I am speaking with the correct person using two identifiers. ? ?PCP:  Lindell Spar, MD  ? ?Chief Complaint: Depression ? ?History of Present Illness:   ? ?Monique Long is a 21 y.o. female who has a televisit for complaint of depressed mood and lack of concentration, which is worse for the last 2 weeks since she lost her mother recently.  She has spells of anxiety and insomnia as well.  She is overwhelmed with her current condition.  She has 2 take care of her children and she is pregnant currently as well.  She denies any SI or HI currently. ? ?The patient does not have symptoms concerning for COVID-19 infection (fever, chills, cough, or new shortness of breath).  ? ?Past Medical, Surgical, Social History, Allergies, and Medications have been Reviewed. ? ?Past Medical History:  ?Diagnosis Date  ? Anxiety   ? Asthma   ? Cancer [facility])   ? Depression   ? Dysrhythmia    ? Hypothyroidism   ? Thyroid disease   ? [employer]rees[employer] 02/29/2020  ? ?Past Surgical History:  ?Procedure Laterality Date  ? NO PAST SURGERIES    ? THYROIDECTOMY Bilateral 10/09/2020  ? Procedure: TOTAL THYROIDECTOMY;  Surgeon: Armandina Gemma, MD;  Location: [facility];  Service: General;  Laterality: Bilateral;  ?  ? ?Current Meds  ?Medication Sig  ? levothyroxine (SYNTHROID) 150 MCG tablet Take 1 tablet (150 mcg total) by mouth daily before breakfast.  ? ondansetron (ZOFRAN-ODT) 4 MG disintegrating tablet Take 1 tablet (4 mg total) by mouth every 8 (eight) hours as needed for nausea or vomiting.  ? Prenatal Vit-Fe Fumarate-FA (PRENATAL VITAMIN PO) Take 1 tablet by mouth every other day.  ?  ? ?Allergies:   Amoxicillin and Penicillins  ? ?ROS:   ?Please see the history of present illness.    ? ?All other systems reviewed and are negative. ? ? ?Labs/Other Tests and Data Reviewed:   ? ?Recent Labs: ?05/09/2021: TSH 83.000 ?05/30/2021: ALT 14; BUN 12; Creatinine, Ser 0.67; Hemoglobin 12.2; Platelets 220; Potassium 3.5; Sodium 136  ? ?Recent Lipid Panel ?Lab Results  ?Component Value Date/Time  ? CHOL 147 02/23/2020 10:19 AM  ? TRIG 67 02/23/2020 10:19 AM  ? HDL 44 02/23/2020 10:19 AM  ? CHOLHDL 3.3 02/23/2020 10:19 AM  ? LDLCALC 90 02/23/2020 10:19 AM  ? ? ?Wt Readings from Last 3 Encounters:  ?05/30/21 133 lb (60.3 kg)  ?05/14/21 133 lb 2.5 oz (60.4 kg)  ?05/10/21 133 lb (60.3  kg)  ?  ? ?ASSESSMENT & PLAN:   ? ?Depression, major, single episode, moderate ([place]) ?[place] Office Visit from 07/03/2021 in [facility]  ?PHQ-9 Total Score 13  ? ?  ? ? 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 ? ?Time:   ?Today, I have spent 12 minutes reviewing the chart, including problem list, medications, and with the patient with  telehealth technology discussing the above problems. ? ? ?Medication Adjustments/Labs and Tests Ordered: ?Current medicines are reviewed at length with the patient today.  Concerns regarding medicines are outlined above.  ? ?Tests Ordered: ?Orders Placed This Encounter  ?Procedures  ? AMB Referral to [facility]  ? ? ?Medication Changes: ?No orders of the defined types were placed in this encounter. ? ? ? ?Note: This dictation was prepared with Dragon dictation along with smaller phrase technology. Similar sounding words can be transcribed inadequately or may not be corrected upon review. Any transcriptional errors that result from this process are unintentional.  ?  ? ? ?Disposition:  Follow up  ?Signed, ?Lindell Spar, MD  ?07/03/2021 4:40 PM    ? ?[facility] ?[facility] ?

## 2021-07-03 NOTE — Assessment & Plan Note (Addendum)
[place] Office Visit from 07/03/2021 in [facility]  ?PHQ-9 Total Score 13  ?  ? ? 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

## 2021-07-08 ENCOUNTER — Ambulatory Visit
Admission: EM | Admit: 2021-07-08 | Discharge: 2021-07-08 | Disposition: A | Discharge status: Home / Self Care | Payer: Medicaid Other | Attending: Nurse Practitioner | Admitting: Nurse Practitioner

## 2021-07-08 DIAGNOSIS — B9789 Other viral agents as the cause of diseases classified elsewhere: Secondary | ICD-10-CM

## 2021-07-08 DIAGNOSIS — J329 Chronic sinusitis, unspecified: Secondary | ICD-10-CM

## 2021-07-08 DIAGNOSIS — R051 Acute cough: Secondary | ICD-10-CM

## 2021-07-08 LAB — POCT RAPID STREP A (OFFICE): Rapid Strep A Screen: NEGATIVE

## 2021-07-08 MED ORDER — DEXTROMETHORPHAN-GUAIFENESIN 10-100 MG/5ML PO LIQD: 5.0000 mL | ORAL | 0 refills | Status: DC | PRN

## 2021-07-08 MED ORDER — ONDANSETRON 4 MG PO TBDP
4.0000 mg | ORAL_TABLET | Freq: Three times a day (TID) | ORAL | 0 refills | Status: DC | PRN
Start: 2021-07-08 — End: 2023-05-08

## 2021-07-08 MED ORDER — FLUTICASONE PROPIONATE 50 MCG/ACT NA SUSP: 2.0000 | Freq: Every day | NASAL | 0 refills | Status: AC

## 2021-07-08 NOTE — ED Triage Notes (Signed)
Pt reports cough, sore throat, sinus pressure, can't keep anything down x 2 days.  ?

## 2021-07-08 NOTE — Discharge Instructions (Addendum)
Your rapid strep test is negative today.  A throat culture has been ordered for confirmatory testing.  If the results are positive, you will be contacted and provided treatment. ?Take medication as prescribed. ?Increase fluids and get plenty of rest. ?Warm salt water gargles 3-4 times daily to help with throat symptoms. ?Use a humidifier at bedtime during sleep. ?Follow-up with your obstetrician if your symptoms do not improve. ?

## 2021-07-08 NOTE — ED Provider Notes (Signed)
?[facility] ? ? ? ?CSN: [id number] ?Arrival date & time: 07/08/21  1219 ? ? ?  ? ?History   ?Chief Complaint ?Chief Complaint  ?Patient presents with  ? Cough  ? Emesis  ? Sore Throat  ? ? ?HPI ?Monique Long is a 21 y.o. female.  ? ?The patient is a 21 year old female who presents with upper respiratory symptoms.  Symptoms have been present for the past 2 days.  Patient complains of cough, nasal congestion, and sore throat.  She states that the cough has worsened over the past 24 hours.  She denies fever, chills, headache, shortness of breath, wheezing, abdominal pain, or diarrhea.  She states that she has had an episode of vomiting that started today.  The patient reports that she is approximately 7 months pregnant.  She has not taken any medication for her symptoms. ? ?The history is provided by the patient.  ?Emesis ?Associated symptoms: cough and sore throat   ?Associated symptoms: no fever   ?Sore Throat ?Pertinent negatives include no shortness of breath.  ? ?Past Medical History:  ?Diagnosis Date  ? Anxiety   ? Asthma   ? Cancer [facility])   ? Depression   ? Dysrhythmia   ? Hypothyroidism   ? Thyroid disease   ? [employer] 02/29/2020  ? ? ?Patient Active Problem List  ? Diagnosis Date Noted  ? Depression, major, single episode, moderate ([place]) 07/03/2021  ? Multiple thyroid nodules 07/11/2020  ? Hypotension 07/11/2020  ? Menstruation, irregular 02/23/2020  ? Hypothyroidism due to Hashimoto's thyroiditis 01/10/2020  ? ? ?Past Surgical History:  ?Procedure Laterality Date  ? NO PAST SURGERIES    ? THYROIDECTOMY Bilateral 10/09/2020  ? Procedure: TOTAL THYROIDECTOMY;  Surgeon: Armandina Gemma, MD;  Location: [facility];  Service: General;  Laterality: Bilateral;  ? ? ?OB History   ? ? Gravida  ?1  ? Para  ?   ? Term  ?   ? Preterm  ?   ? AB  ?   ? Living  ?   ?  ? ? SAB  ?   ? IAB  ?   ? Ectopic  ?   ? Multiple  ?   ? Live Births  ?   ?   ?  ?  ? ? ? ?Home Medications   ? ?Prior to Admission medications   ?Medication  Sig Start Date End Date Taking? Authorizing Provider  ?dextromethorphan-guaiFENesin (ROBITUSSIN-DM) 10-100 MG/5ML liquid Take 5 mLs by mouth every 4 (four) hours as needed for cough. 07/08/21  Yes Poetry Cerro-Warren, Alda Lea, NP  ?fluticasone (FLONASE) 50 MCG/ACT nasal spray Place 2 sprays into both nostrils daily. 07/08/21  Yes Rodrigo Mcgranahan-Warren, Alda Lea, NP  ?fluticasone (FLONASE) 50 MCG/ACT nasal spray Place 2 sprays into both nostrils daily. 07/08/21  Yes Spiros Greenfeld-Warren, Alda Lea, NP  ?ondansetron (ZOFRAN-ODT) 4 MG disintegrating tablet Take 1 tablet (4 mg total) by mouth every 8 (eight) hours as needed for nausea or vomiting. 07/08/21  Yes Venecia Mehl-Warren, Alda Lea, NP  ?levothyroxine (SYNTHROID) 150 MCG tablet Take 1 tablet (150 mcg total) by mouth daily before breakfast. 05/10/21   Nida, Marella Chimes, MD  ?Prenatal Vit-Fe Fumarate-FA (PRENATAL VITAMIN PO) Take 1 tablet by mouth every other day.    [provider]  ? ? ?Family History ?Family History  ?Problem Relation Age of Onset  ? Hypertension Mother   ? Heart failure Mother   ? Cancer Father   ? ? ?Social History ?Social History  ? ?  Tobacco Use  ? Smoking status: Never  ? Smokeless tobacco: Never  ?Vaping Use  ? Vaping Use: Never used  ?Substance Use Topics  ? Alcohol use: No  ? Drug use: Not Currently  ? ? ? ?Allergies   ?Amoxicillin and Penicillins ? ? ?Review of Systems ?Review of Systems  ?Constitutional:  Positive for activity change, appetite change and fatigue. Negative for fever.  ?HENT:  Positive for congestion, sinus pressure and sore throat. Negative for ear pain and facial swelling.   ?Eyes: Negative.   ?Respiratory:  Positive for cough. Negative for shortness of breath and wheezing.   ?Cardiovascular: Negative.   ?Gastrointestinal:  Positive for nausea and vomiting.  ?Skin: Negative.   ?Psychiatric/Behavioral: Negative.    ? ? ?Physical Exam ?Triage Vital Signs ?ED Triage Vitals  ?Enc Vitals Group  ?   BP 07/08/21 1333 (!) 96/57  ?   Pulse  Rate 07/08/21 1333 98  ?   Resp 07/08/21 1333 18  ?   Temp 07/08/21 1333 98 ?F (36.7 ?C)  ?   Temp Source 07/08/21 1333 Oral  ?   SpO2 07/08/21 1333 99 %  ?   Weight --   ?   Height --   ?   Head Circumference --   ?   Peak Flow --   ?   Pain Score 07/08/21 1334 10  ?   Pain Loc --   ?   Pain Edu? --   ?   Excl. in [place]? --   ? ?No data found. ? ?Updated Vital Signs ?BP (!) 96/57 (BP Location: Right Arm)   Pulse 98   Temp 98 ?F (36.7 ?C) (Oral)   Resp 18   LMP 12/09/2020 (Approximate)   SpO2 99%  ? ?Visual Acuity ?Right Eye Distance:   ?Left Eye Distance:   ?Bilateral Distance:   ? ?Right Eye Near:   ?Left Eye Near:    ?Bilateral Near:    ? ?Physical Exam ?Vitals reviewed.  ?Constitutional:   ?   General: She is in acute distress (moderate coughing during exam).  ?   Appearance: She is well-developed.  ?HENT:  ?   Head: Normocephalic and atraumatic.  ?   Right Ear: Tympanic membrane and ear canal normal. Tympanic membrane is not erythematous.  ?   Left Ear: Tympanic membrane and ear canal normal. Tympanic membrane is not erythematous.  ?   Nose: Congestion and rhinorrhea present.  ?   Mouth/Throat:  ?   Pharynx: Uvula midline. Posterior oropharyngeal erythema present. No pharyngeal swelling or uvula swelling.  ?   Tonsils: No tonsillar exudate.  ?Eyes:  ?   Conjunctiva/sclera: Conjunctivae normal.  ?   Pupils: Pupils are equal, round, and reactive to light.  ?Cardiovascular:  ?   Rate and Rhythm: Normal rate and regular rhythm.  ?   Heart sounds: Normal heart sounds.  ?Abdominal:  ?   General: Bowel sounds are normal.  ?   Palpations: Abdomen is soft.  ?   Tenderness: There is no abdominal tenderness.  ?Musculoskeletal:  ?   Cervical back: Normal range of motion.  ?Lymphadenopathy:  ?   Cervical: No cervical adenopathy.  ?Skin: ?   General: Skin is warm and dry.  ?Neurological:  ?   General: No focal deficit present.  ?   Mental Status: She is alert and oriented to person, place, and time.  ?Psychiatric:     ?    Mood and Affect: Mood normal.     ?  Behavior: Behavior normal.  ? ? ? ?UC Treatments / Results  ?Labs ?(all labs ordered are listed, but only abnormal results are displayed) ?Labs Reviewed  ?POCT RAPID STREP A (OFFICE)  ? ? ?EKG ? ? ?Radiology ?No results found. ? ?Procedures ?Procedures (including critical care time) ? ?Medications Ordered in UC ?Medications - No data to display ? ?Initial Impression / Assessment and Plan / UC Course  ?I have reviewed the triage vital signs and the nursing notes. ? ?Pertinent labs & imaging results that were available during my care of the patient were reviewed by me and considered in my medical decision making (see chart for details). ? ?The patient is a 21 year old female who presents with upper respiratory symptoms.  Symptoms have been present for the past 2 days.  Patient complains of cough, nasal congestion, and sore throat.  She has a cough that has worsened over the past 24 hours.  On exam, the patient has a moderate cough.  She denies any shortness of breath or wheezing.  Patient is approximately 7 months pregnant.  She has not taken any medication for her symptoms as she states she does not know what she can take.  Will hold off on prescribing any antibiotics for the patient at this time due to her current pregnancy.  In the interim, symptomatic treatment will be provided her symptoms consistent with an upper respiratory infection at this time.  We will start the patient on guaifenesin/dextromethorphan to help with her cough.  Patient was also provided fluticasone to help with her nasal congestion.  These medications should be safe for her pregnancy at this time.  Patient was advised to increase fluids and get plenty of rest.  Patient was also advised to use a humidifier during sleep and to sleep elevated on 2 pillows.  Patient was advised to follow-up with her obstetrician if her symptoms worsen or do not improve to ensure she is on the appropriate antibiotic if  necessary. ?Final Clinical Impressions(s) / UC Diagnoses  ? ?Final diagnoses:  ?Viral sinusitis  ?Acute cough  ? ? ? ?Discharge Instructions   ? ?  ?Your rapid strep test is negative today.  A throat culture has be

## 2021-07-09 ENCOUNTER — Other Ambulatory Visit: Payer: Self-pay | Admitting: Licensed Clinical Social Worker

## 2021-07-09 NOTE — Patient Instructions (Signed)
Visit Information ? ?Monique Long was given information about Medicaid Managed Care team care coordination services as a part of their [facility] Medicaid benefit. Monique Long verbally consented to engagement with the [facility] Managed Care team.  ? ?If you are experiencing a medical emergency, please call 911 or report to your local emergency department or urgent care.  ? ?If you have a non-emergency medical problem during routine business hours, please contact your provider's office and ask to speak with a nurse.  ? ?For questions related to your [facility] health plan, please call: [phone] or go here:[email or web address] ? ?If you would like to schedule transportation through your [facility] plan, please call the following number at least 2 days in advance of your appointment: [phone]. ? You can also use the MTM portal or MTM mobile app to manage your rides. For the portal, please go to [email or web address]. ? ?Call the [employer] at [phone], at any time, 24 hours a day, 7 days a week. If you are in danger or need immediate medical attention call 911. ? ?If you would like help to quit smoking, call 1-800-QUIT-NOW 681-393-4853) OR Espa?ol: 1-855-D?jelo-Ya (424) 341-4274) o para m?s informaci?n haga clic aqu? or Text READY to 200-400 to register via text ? ? ?Following is a copy of your plan of care:  ?Care Plan : Monique Long  ?Updates made by Monique Cutter, LCSW since 07/09/2021 12:00 AM  ?  ? ?Problem: Coping Skills (General Plan of Care)   ?  ? ?Long-Range Goal: Coping Skills Enhanced due to active grief and depression   ?Start Date: 07/09/2021  ?Priority: High  ?Note:   ?Priority: High ? ?Timeframe:  Long-Range Goal ?Priority:  High ?Start Date:   07/09/21              ?Expected End Date:  ongoing                   ?  ?Follow Up Date--08/07/21 ? ?- check out counseling ?- keep 90 percent of counseling appointments ?- schedule counseling appointment  ?  ?Why is  this important?   ?          Beating depression may take some time.  ?          If you don't feel better right away, don't give up on your treatment plan.  ? ? Current barriers:   ?Chronic Mental Health needs related to depression, grief and stress. Patient requires Support, Education, Resources, Referrals, Advocacy, and Care Coordination, in order to meet Unmet Mental Health Needs. ?Patient will implement clinical interventions discussed today to decrease symptoms of depression and increase knowledge and/or ability of: coping skills. ?Mental Health Concerns, Active Grief and Social Isolation ?Patient lacks knowledge of available community counseling agencies and resources. ? ?Clinical Goal(s): verbalize understanding of plan for management of Grief, Depression, and Stress and demonstrate a reduction in symptoms. Patient will connect with a provider for ongoing mental health treatment, increase coping skills, healthy habits, self-management skills, and stress reduction     ?   ?Patient Goals/Self-Care Activities: Over the next 120 days ?Attend scheduled medical appointments ?Utilize healthy coping skills and supportive resources discussed ?Contact PCP with any questions or concerns ?Keep 90 percent of counseling appointments ?Call your insurance provider for more information about your Enhanced Benefits  ?Check out counseling resources provided  ?Begin personal counseling with LCSW, to reduce and manage symptoms of Depression and Stress, until well-established with mental health  provider ?Incorporate into daily practice - relaxation techniques, deep breathing exercises, and mindfulness meditation strategies. ?Talk about feelings with friends, family members, spiritual advisor, etc. ?Contact LCSW directly [phone]), if you have questions, need assistance, or if additional social work needs are identified between now and our next scheduled telephone outreach call. ?Call 988 for mental health hotline/crisis line  if needed (24/7 available) ?Try techniques to reduce symptoms of anxiety/negative thinking (deep breathing, distraction, positive self talk, etc)  ?- develop a personal safety plan ?- develop a plan to deal with triggers like holidays, anniversaries ?- exercise at least 2 to 3 times per week ?- have a plan for how to handle bad days ?- journal feelings and what helps to feel better or worse ?- spend time or talk with others at least 2 to 3 times per week ?- watch for early signs of feeling worse ?- begin personal counseling ?- call and visit an old friend ?- check out volunteer opportunities ?- join a support group ?- laugh; watch a funny movie or comedian ?- learn and use visualization or guided imagery ?- perform a random act of kindness ?- practice relaxation or meditation daily ?- start or continue a personal journal ?- practice positive thinking and self-talk ?-continue with compliance of taking medication  ?-identify current effective and ineffective coping strategies.  ?-implement positive self-talk in care to increase self-esteem, confidence and feelings of control.  ?-consider alternative and complementary therapy approaches such as meditation, mindfulness or yoga.  ?-journaling, prayer, worship services, meditation or pastoral counseling.  ?-increase participation in pleasurable group activities such as hobbies, singing, sports or volunteering).  ?-consider the use of meditative movement therapy such as tai chi, yoga or qigong.  ?-start a regular daily exercise program based on tolerance, ability and patient choice to support positive thinking and activity    ? ?Patient Goals: Initial goal ?  ? Monique Long, BSW, MSW, LCSW ?Managed Medicaid LCSW ?[name] ?[name] ?Phone: [phone] ? ? ?

## 2021-07-09 NOTE — Patient Outreach (Addendum)
?Medicaid Managed Care ?Social Work Note ? ?07/09/2021 ?Name:  Monique Long MRN:  [id number] DOB:  October 23, 2000 ? ?Monique Long is an 21 y.o. year old female who is a primary patient of Lindell Spar, MD.  The Medicaid Managed Care Coordination team was consulted for assistance with:  Mental Health Counseling and Resources ? ?Ms. Persinger was given information about Medicaid Managed Care Coordination team services today. [place] Patient agreed to services and verbal consent obtained. ? ?Engaged with patient  for by telephone forinitial visit in response to referral for case management and/or care coordination services.  ? ?Assessments/Interventions:  Review of past medical history, allergies, medications, health status, including review of consultants reports, laboratory and other test data, was performed as part of comprehensive evaluation and provision of chronic care management services. ? ?SDOH: (Social Determinant of Health) assessments and interventions performed: ?SDOH Interventions   ? ?Flowsheet Row Most Recent Value  ?SDOH Interventions   ?Stress Interventions Provide Counseling, [employer]  ? ?  ? ? ?Advanced Directives Status:  See Care Plan for related entries. ? ?Care Plan ?                ?Allergies  ?Allergen Reactions  ? Amoxicillin Rash  ? Penicillins Rash  ? ? ?Medications Reviewed Today   ? ? Reviewed by Tish Men, NP (Nurse Practitioner) on 07/08/21 at 1403  Med List Status: <None>  ? ?Medication Order Taking? Sig Documenting Provider Last Dose Status Informant  ?levothyroxine (SYNTHROID) 150 MCG tablet [id number]  Take 1 tablet (150 mcg total) by mouth daily before breakfast. Cassandria Anger, MD  Active   ?ondansetron (ZOFRAN-ODT) 4 MG disintegrating tablet [id number]  Take 1 tablet (4 mg total) by mouth every 8 (eight) hours as needed for nausea or vomiting. Truddie Hidden, MD  Consider Medication Status and Discontinue   ?Prenatal Vit-Fe  Fumarate-FA (PRENATAL VITAMIN PO) [id number]  Take 1 tablet by mouth every other day. [provider]  Active   ? ?  ?  ? ?  ? ? ?Patient Active Problem List  ? Diagnosis Date Noted  ? Depression, major, single episode, moderate ([place]) 07/03/2021  ? Multiple thyroid nodules 07/11/2020  ? Hypotension 07/11/2020  ? Menstruation, irregular 02/23/2020  ? Hypothyroidism due to Hashimoto's thyroiditis 01/10/2020  ? ? ?Conditions to be addressed/monitored per PCP order:  Depression ? ?Care Plan : LCSW Plan of Care  ?Updates made by Greg Cutter, LCSW since 07/09/2021 12:00 AM  ?  ? ?Problem: Coping Skills (General Plan of Care)   ?  ? ?Long-Range Goal: Coping Skills Enhanced due to active grief and depression   ?Start Date: 07/09/2021  ?Priority: High  ?Note:   ?Priority: High ? ?Timeframe:  Long-Range Goal ?Priority:  High ?Start Date:   07/09/21              ?Expected End Date:  ongoing                   ?  ?Follow Up Date--08/07/21 ? ?- check out counseling ?- keep 90 percent of counseling appointments ?- schedule counseling appointment  ?  ?Why is this important?   ?          Beating depression may take some time.  ?          If you don't feel better right away, don't give up on your treatment plan.  ? ? Current barriers:   ?  Chronic Mental Health needs related to depression, grief and stress. Patient requires Support, Education, Resources, Referrals, Advocacy, and Care Coordination, in order to meet Unmet Mental Health Needs. ?Patient will implement clinical interventions discussed today to decrease symptoms of depression and increase knowledge and/or ability of: coping skills. ?Mental Health Concerns, Active Grief and Social Isolation ?Patient lacks knowledge of available community counseling agencies and resources. ? ?Clinical Goal(s): verbalize understanding of plan for management of Grief, Depression, and Stress and demonstrate a reduction in symptoms. Patient will connect with a provider for ongoing mental  health treatment, increase coping skills, healthy habits, self-management skills, and stress reduction     ?   ?Clinical Interventions:  ?Assessed patient's previous and current treatment, coping skills, support system and barriers to care.  ?Verbalization of feelings encouraged, motivational interviewing employed ?Emotional support provided, positive coping strategies explored ?Assessment of current grief completed ?Patient has depression but does not wish to be on medication at this time and prefers long term counseling to help alleviate her symptoms.  ?Patient reports significant worsening grief and depression impacting their ability to function appropriately and carry out daily task. ?Patient is agreeable to send a text to therapist at [facility] (counselor's preferred way of communication for referrals are by text and by patient directly.) Patient wishes to start telephonic counseling with this therapist. Patient was educated on other available mental health resources within her area but she does not want to pursue those resources at this time. [facility] LCSW sent patient a secure email on 07/09/21 with [facility] counselor's contact information. Patient successfully received email.  ?LCSW provided education on relaxation techniques such as meditation, deep breathing, massage, grounding exercises or yoga that can activate the body's relaxation response and ease symptoms of stress and anxiety. LCSW ask that when pt is struggling with difficult emotions and racing thoughts that they start this relaxation response process. LCSW provided extensive education on healthy coping skills for anxiety. SW used active and reflective listening, validated patient's feelings/concerns, and provided emotional support. ?Patient will work on implementing appropriate self-care habits into their daily routine such as: staying positive, writing a gratitude list, drinking water, staying active around the house, taking their  medications as prescribed, combating negative thoughts or emotions and staying connected with their family and friends. Positive reinforcement provided for this decision to work on this. ?Motivational Interviewing employed ?Depression screen reviewed  ?PHQ2/ PHQ9 completed ?Mindfulness or Relaxation training provided ?Active listening / Reflection utilized  ?Advance Care and HCPOA education provided ?Emotional Support Provided ?Problem Solving /Task Center strategies reviewed ?Provided psychoeducation for mental health needs  ?Provided brief CBT   ?Quality of sleep assessed & Sleep Hygiene techniques promoted  ?Participation in counseling encouraged  ?Verbalization of feelings encouraged  ?Suicidal Ideation/Homicidal Ideation assessed: Patient denies SI/HI  ?Review resources, discussed options and provided patient information about  ?Mental Health Resources ?Inter-disciplinary care team collaboration (see longitudinal plan of care) ?Patient Goals/Self-Care Activities: Over the next 120 days ?Attend scheduled medical appointments ?Utilize healthy coping skills and supportive resources discussed ?Contact PCP with any questions or concerns ?Keep 90 percent of counseling appointments ?Call your insurance provider for more information about your Enhanced Benefits  ?Check out counseling resources provided  ?Begin personal counseling with LCSW, to reduce and manage symptoms of Depression and Stress, until well-established with mental health provider ?Incorporate into daily practice - relaxation techniques, deep breathing exercises, and mindfulness meditation strategies. ?Talk about feelings with friends, family members, spiritual advisor, etc. ?Contact  LCSW directly [phone]), if you have questions, need assistance, or if additional social work needs are identified between now and our next scheduled telephone outreach call. ?Call 988 for mental health hotline/crisis line if needed (24/7 available) ?Try techniques to  reduce symptoms of anxiety/negative thinking (deep breathing, distraction, positive self talk, etc)  ?- develop a personal safety plan ?- develop a plan to deal with triggers like holidays, anniversaries ?- exe

## 2021-07-11 ENCOUNTER — Ambulatory Visit: Payer: Medicaid Other | Admitting: "Endocrinology

## 2021-08-07 ENCOUNTER — Other Ambulatory Visit: Payer: Self-pay | Admitting: Licensed Clinical Social Worker

## 2021-08-07 NOTE — Patient Instructions (Signed)
Visit Information  Monique Long was given information about Medicaid Managed Care team care coordination services as a part of their [facility] Medicaid benefit. Monique Long verbally consented to engagement with the [facility] Managed Care team.   If you are experiencing a medical emergency, please call 911 or report to your local emergency department or urgent care.   If you have a non-emergency medical problem during routine business hours, please contact your provider's office and ask to speak with a nurse.   For questions related to your [facility] health plan, please call: [phone] or go here:[email or web address]  If you would like to schedule transportation through your [facility] plan, please call the following number at least 2 days in advance of your appointment: [phone].  You can also use the MTM portal or MTM mobile app to manage your rides. For the portal, please go to [email or web address].  Call the [place] at [phone], at any time, 24 hours a day, 7 days a week. If you are in danger or need immediate medical attention call 911.  If you would like help to quit smoking, call 1-800-QUIT-NOW 385-229-1678) OR Espaol: 1-855-Djelo-Ya (0-017-494-4967) o para ms informacin haga clic aqu or Text READY to 200-400 to register via text   Following is a copy of your plan of care:  Care Plan : LCSW Plan of Care  Updates made by Greg Cutter, LCSW since 08/07/2021 12:00 AM     Problem: Coping Skills (General Plan of Care)      Long-Range Goal: Coping Skills Enhanced due to active grief and depression   Start Date: 07/09/2021  Priority: High  Note:   Priority: High  Timeframe:  Long-Range Goal Priority:  High Start Date:   07/09/21              Expected End Date:  08/07/21                     Goal closed as of 08/07/21  - check out counseling - keep 90 percent of counseling appointments - schedule counseling appointment    Why  is this important?             Beating depression may take some time.            If you don't feel better right away, don't give up on your treatment plan.    Current barriers:   Chronic Mental Health needs related to depression, grief and stress. Patient requires Support, Education, Resources, Referrals, Advocacy, and Care Coordination, in order to meet Unmet Mental Health Needs. Patient will implement clinical interventions discussed today to decrease symptoms of depression and increase knowledge and/or ability of: coping skills. Mental Health Concerns, Active Grief and Social Isolation Patient lacks knowledge of available community counseling agencies and resources.  Clinical Goal(s): verbalize understanding of plan for management of Grief, Depression, and Stress and demonstrate a reduction in symptoms. Patient will connect with a provider for ongoing mental health treatment, increase coping skills, healthy habits, self-management skills, and stress reduction        Patient Goals/Self-Care Activities: Over the next 120 days Attend scheduled medical appointments Utilize healthy coping skills and supportive resources discussed Contact PCP with any questions or concerns Keep 90 percent of counseling appointments Call your insurance provider for more information about your Enhanced Benefits  Check out counseling resources provided  Begin personal counseling with LCSW, to reduce and manage symptoms of Depression and Stress, until well-established with  mental health provider Incorporate into daily practice - relaxation techniques, deep breathing exercises, and mindfulness meditation strategies. Talk about feelings with friends, family members, spiritual advisor, etc. Contact LCSW directly [phone]), if you have questions, need assistance, or if additional social work needs are identified between now and our next scheduled telephone outreach call. Call 988 for mental health hotline/crisis  line if needed (24/7 available) Try techniques to reduce symptoms of anxiety/negative thinking (deep breathing, distraction, positive self talk, etc)  - develop a personal safety plan - develop a plan to deal with triggers like holidays, anniversaries - exercise at least 2 to 3 times per week - have a plan for how to handle bad days - journal feelings and what helps to feel better or worse - spend time or talk with others at least 2 to 3 times per week - watch for early signs of feeling worse - begin personal counseling - call and visit an old friend - check out volunteer opportunities - join a support group - laugh; watch a funny movie or comedian - learn and use visualization or guided imagery - perform a random act of kindness - practice relaxation or meditation daily - start or continue a personal journal - practice positive thinking and self-talk -continue with compliance of taking medication  -identify current effective and ineffective coping strategies.  -implement positive self-talk in care to increase self-esteem, confidence and feelings of control.  -consider alternative and complementary therapy approaches such as meditation, mindfulness or yoga.  -journaling, prayer, worship services, meditation or pastoral counseling.  -increase participation in pleasurable group activities such as hobbies, singing, sports or volunteering).  -consider the use of meditative movement therapy such as tai chi, yoga or qigong.  -start a regular daily exercise program based on tolerance, ability and patient choice to support positive thinking and activity     Patient Goals: Completed goal. Patient started counseling.  [name], BSW, MSW, [employer] Managed Medicaid LCSW [email or web address] Phone: [phone]

## 2021-08-07 NOTE — Patient Outreach (Signed)
Medicaid Managed Care Social Work Note  08/07/2021 Name:  Monique Long MRN:  [id number] DOB:  [birth year]  Monique Long is an 21 y.o. year old female who is a primary patient of Lindell Spar, MD.  The Medicaid Managed Care Coordination team was consulted for assistance with:  [place] and Resources  Ms. Maciver was given information about Medicaid Managed Care Coordination team services today. [place] Patient agreed to services and verbal consent obtained.  Engaged with patient  for by telephone forfollow up visit in response to referral for case management and/or care coordination services.   Assessments/Interventions:  Review of past medical history, allergies, medications, health status, including review of consultants reports, laboratory and other test data, was performed as part of comprehensive evaluation and provision of chronic care management services.  SDOH: (Social Determinant of Health) assessments and interventions performed: SDOH Interventions    Flowsheet Row Most Recent Value  SDOH Interventions   Stress Interventions Offered [employer], Provide Counseling       Advanced Directives Status:  See Care Plan for related entries.  Care Plan                 Allergies  Allergen Reactions   Amoxicillin Rash   Penicillins Rash    Medications Reviewed Today     Reviewed by Greg Cutter, LCSW (Social Worker) on 08/07/21 at 1445  Med List Status: <None>   Medication Order Taking? Sig Documenting Provider Last Dose Status Informant  dextromethorphan-guaiFENesin (ROBITUSSIN-DM) 10-100 MG/5ML liquid [id number]  Take 5 mLs by mouth every 4 (four) hours as needed for cough. Leath-Warren, Alda Lea, NP  Active   fluticasone (FLONASE) 50 MCG/ACT nasal spray [id number]  Place 2 sprays into both nostrils daily. Leath-Warren, Alda Lea, NP  Active   fluticasone (FLONASE) 50 MCG/ACT nasal spray [id number]  Place 2 sprays into both nostrils  daily. Leath-Warren, Alda Lea, NP  Active   levothyroxine (SYNTHROID) 150 MCG tablet [id number] No Take 1 tablet (150 mcg total) by mouth daily before breakfast. Cassandria Anger, MD Taking Active   ondansetron (ZOFRAN-ODT) 4 MG disintegrating tablet [id number]  Take 1 tablet (4 mg total) by mouth every 8 (eight) hours as needed for nausea or vomiting. Leath-Warren, Alda Lea, NP  Active   Prenatal Vit-Fe Fumarate-FA (PRENATAL VITAMIN PO) [id number] No Take 1 tablet by mouth every other day. [provider] Taking Active             Patient Active Problem List   Diagnosis Date Noted   Depression, major, single episode, moderate ([place]) 07/03/2021   Multiple thyroid nodules 07/11/2020   Hypotension 07/11/2020   Menstruation, irregular 02/23/2020   Hypothyroidism due to Hashimoto's thyroiditis 01/10/2020    Conditions to be addressed/monitored per PCP order:  Depression  Care Plan : LCSW Plan of Care  Updates made by Greg Cutter, LCSW since 08/07/2021 12:00 AM     Problem: Coping Skills (General Plan of Care)      Long-Range Goal: Coping Skills Enhanced due to active grief and depression   Start Date: 07/09/2021  Priority: High  Note:   Priority: High  Timeframe:  Long-Range Goal Priority:  High Start Date:   07/09/21              Expected End Date:  08/07/21                     Goal closed as of 08/07/21  -  check out counseling - keep 90 percent of counseling appointments - schedule counseling appointment    Why is this important?             Beating depression may take some time.            If you don't feel better right away, don't give up on your treatment plan.    Current barriers:   Chronic Mental Health needs related to depression, grief and stress. Patient requires Support, Education, Resources, Referrals, Advocacy, and Care Coordination, in order to meet Unmet Mental Health Needs. Patient will implement clinical interventions discussed today to  decrease symptoms of depression and increase knowledge and/or ability of: coping skills. Mental Health Concerns, Active Grief and Social Isolation Patient lacks knowledge of available community counseling agencies and resources.  Clinical Goal(s): verbalize understanding of plan for management of Grief, Depression, and Stress and demonstrate a reduction in symptoms. Patient will connect with a provider for ongoing mental health treatment, increase coping skills, healthy habits, self-management skills, and stress reduction        Clinical Interventions:  Assessed patient's previous and current treatment, coping skills, support system and barriers to care.  Verbalization of feelings encouraged, motivational interviewing employed Emotional support provided, positive coping strategies explored Assessment of current grief completed Patient has depression but does not wish to be on medication at this time and prefers long term counseling to help alleviate her symptoms.  Patient reports significant worsening grief and depression impacting their ability to function appropriately and carry out daily task. Patient is agreeable to send a text to therapist at [facility] counseling (counselor's preferred way of communication for referrals are by text and by patient directly.) Patient wishes to start telephonic counseling with this therapist. Patient was educated on other available mental health resources within her area but she does not want to pursue those resources at this time. [facility] LCSW sent patient a secure email on 07/09/21 with [facility] counselor's contact information. Patient successfully received email.  LCSW provided education on relaxation techniques such as meditation, deep breathing, massage, grounding exercises or yoga that can activate the body's relaxation response and ease symptoms of stress and anxiety. LCSW ask that when pt is struggling with difficult emotions and racing thoughts that  they start this relaxation response process. LCSW provided extensive education on healthy coping skills for anxiety. SW used active and reflective listening, validated patient's feelings/concerns, and provided emotional support. Patient will work on implementing appropriate self-care habits into their daily routine such as: staying positive, writing a gratitude list, drinking water, staying active around the house, taking their medications as prescribed, combating negative thoughts or emotions and staying connected with their family and friends. Positive reinforcement provided for this decision to work on this. Motivational Interviewing employed Depression screen reviewed  PHQ2/ PHQ9 completed Mindfulness or Relaxation training provided Active listening / Reflection utilized  Advance Care and HCPOA education provided Emotional Support Provided Problem [place] strategies reviewed Provided psychoeducation for mental health needs  Provided brief CBT   Quality of sleep assessed & Sleep Hygiene techniques promoted  Participation in counseling encouraged  Verbalization of feelings encouraged  Suicidal Ideation/Homicidal Ideation assessed: Patient denies SI/HI  Review resources, discussed options and provided patient information about  [place] care team collaboration (see longitudinal plan of care) UPDATE 08/07/21- Patient was successfully connected with a long term mental health therapist at [facility] by [facility] LCSW. Patient has started therapy and  has already completed 3 sessions. [facility] LCSW emailed patient a list of available mental health resources within her area for her to keep on hand in case needed. Patient declines any further social work or mental health needs and is agreeable to case closure at this time. [facility] LCSW will close referral as all clinical needs have been met.  Patient Goals/Self-Care Activities: Over the next 120 days Attend scheduled  medical appointments Utilize healthy coping skills and supportive resources discussed Contact PCP with any questions or concerns Keep 90 percent of counseling appointments Call your insurance provider for more information about your Enhanced Benefits  Check out counseling resources provided  Begin personal counseling with LCSW, to reduce and manage symptoms of Depression and Stress, until well-established with mental health provider Incorporate into daily practice - relaxation techniques, deep breathing exercises, and mindfulness meditation strategies. Talk about feelings with friends, family members, spiritual advisor, etc. Contact LCSW directly [phone]), if you have questions, need assistance, or if additional social work needs are identified between now and our next scheduled telephone outreach call. Call [phone] for mental health hotline/crisis line if needed (24/7 available) Try techniques to reduce symptoms of anxiety/negative thinking (deep breathing, distraction, positive self talk, etc)  - develop a personal safety plan - develop a plan to deal with triggers like holidays, anniversaries - exercise at least 2 to 3 times per week - have a plan for how to handle bad days - journal feelings and what helps to feel better or worse - spend time or talk with others at least 2 to 3 times per week - watch for early signs of feeling worse - begin personal counseling - call and visit an old friend - check out volunteer opportunities - join a support group - laugh; watch a funny movie or comedian - learn and use visualization or guided imagery - perform a random act of kindness - practice relaxation or meditation daily - start or continue a personal journal - practice positive thinking and self-talk -continue with compliance of taking medication  -identify current effective and ineffective coping strategies.  -implement positive self-talk in care to increase self-esteem, confidence and  feelings of control.  -consider alternative and complementary therapy approaches such as meditation, mindfulness or yoga.  -journaling, prayer, worship services, meditation or pastoral counseling.  -increase participation in pleasurable group activities such as hobbies, singing, sports or volunteering).  -consider the use of meditative movement therapy such as tai chi, yoga or qigong.  -start a regular daily exercise program based on tolerance, ability and patient choice to support positive thinking and activity     Patient Goals: Completed goal. Patient started counseling.     Follow up:  Patient requests no follow-up at this time.  Plan: The Managed Medicaid care management team is available to follow up with the patient after provider conversation with the patient regarding recommendation for care management engagement and subsequent re-referral to the care management team.   [name], BSW, MSW, LCSW Managed Medicaid LCSW [email or web address] Phone: [phone]

## 2021-09-20 ENCOUNTER — Ambulatory Visit: Payer: Medicaid Other | Admitting: Internal Medicine

## 2021-09-27 ENCOUNTER — Ambulatory Visit: Payer: Medicaid Other | Admitting: Internal Medicine

## 2021-09-27 ENCOUNTER — Encounter: Payer: Self-pay | Admitting: Internal Medicine

## 2022-01-15 ENCOUNTER — Telehealth: Payer: Self-pay | Admitting: "Endocrinology

## 2022-01-15 DIAGNOSIS — E890 Postprocedural hypothyroidism: Secondary | ICD-10-CM

## 2022-01-15 NOTE — Telephone Encounter (Signed)
Updated lab order

## 2022-01-15 NOTE — Telephone Encounter (Signed)
The patient left a Vm stating she needs a refill on her thyroid medication. I tried to call her back to let her know she needs an appt with labs prior. No answer, no Vm. Can you update her labs

## 2022-05-08 ENCOUNTER — Other Ambulatory Visit: Payer: Self-pay | Admitting: "Endocrinology

## 2022-05-08 ENCOUNTER — Telehealth: Payer: Self-pay | Admitting: "Endocrinology

## 2022-05-08 DIAGNOSIS — E890 Postprocedural hypothyroidism: Secondary | ICD-10-CM

## 2022-05-08 DIAGNOSIS — C73 Malignant neoplasm of thyroid gland: Secondary | ICD-10-CM

## 2022-05-08 MED ORDER — LEVOTHYROXINE SODIUM 137 MCG PO TABS: 137.0000 ug | ORAL_TABLET | Freq: Every day | ORAL | 2 refills | Status: DC

## 2022-05-08 NOTE — Telephone Encounter (Signed)
Tried to call pt but call would not go through.

## 2022-05-08 NOTE — Telephone Encounter (Signed)
Can you add orders for next visit bc pt has not been here in over a year

## 2022-05-08 NOTE — Telephone Encounter (Signed)
Pt stated she had labs done with her OB and they stated her TSH was low and needed to be seen by her endocrinologist.  Her labs are under care everywhere, [facility] health.  She's requesting an appointment today or tomorrow.

## 2022-05-08 NOTE — Telephone Encounter (Signed)
Discussed with pt, understanding voiced. 

## 2022-05-08 NOTE — Telephone Encounter (Signed)
Will someone call her back to go over these instructions with her.  Thanks!

## 2022-06-10 IMAGING — US US THYROID
1 series · 13 of 25 positions shown · non-contrast
Comparison: None.

CLINICAL DATA: Thyroiditis

EXAM:
THYROID ULTRASOUND
TECHNIQUE: Ultrasound examination of the thyroid gland and adjacent soft
tissues was performed.

[Series 1: us thyroid · 13 of 78 slices shown]
[im 1/78]
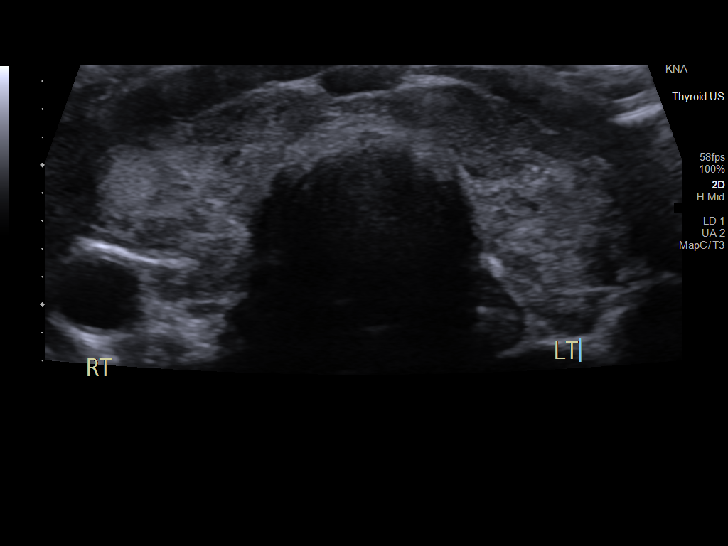
[im 7/78]
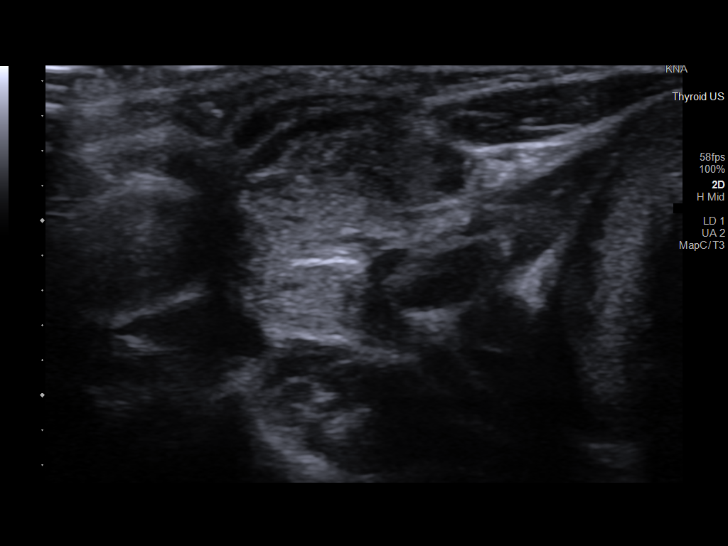
[im 13/78]
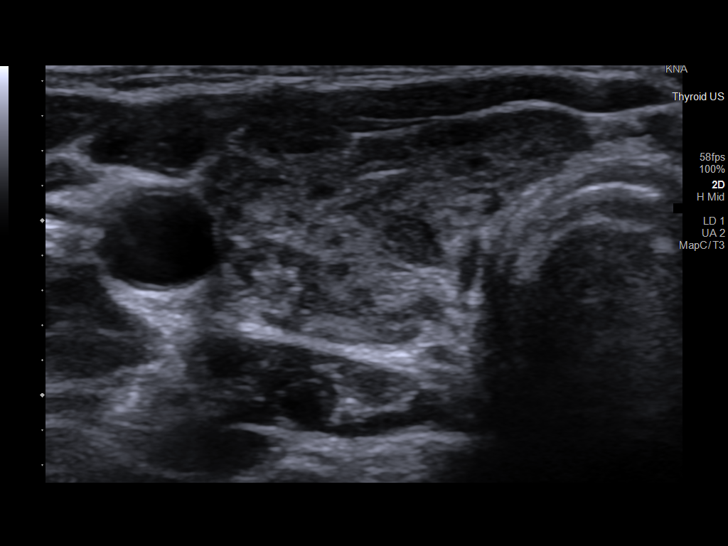
[im 20/78]
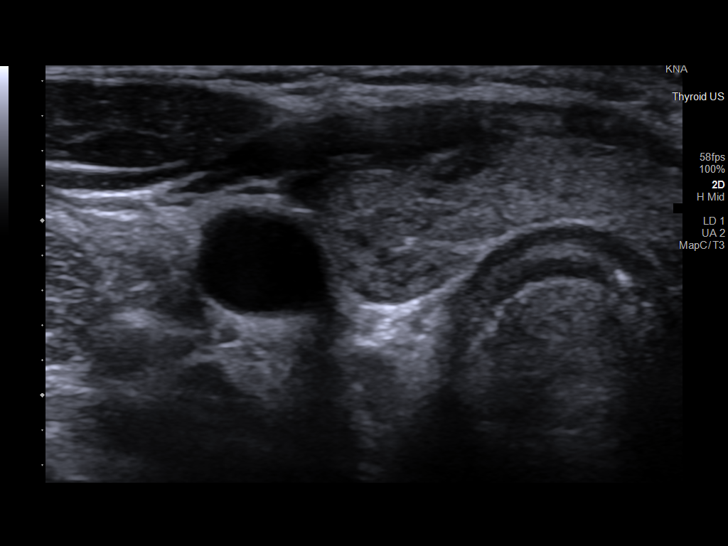
[im 26/78]
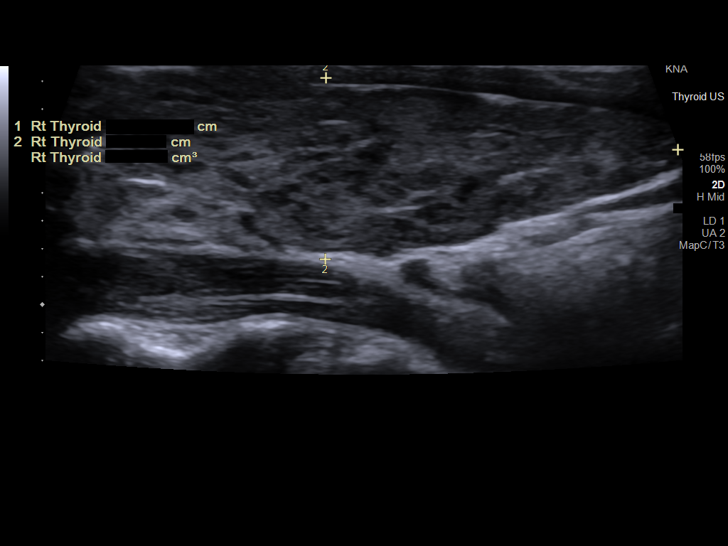
[im 33/78]
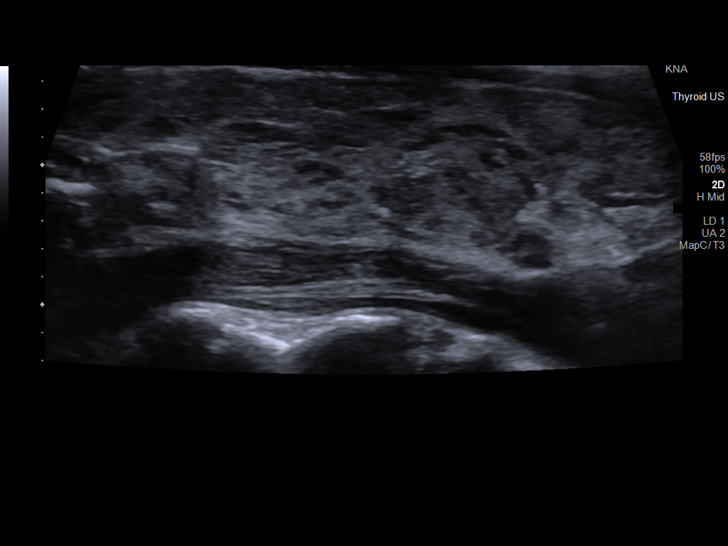
[im 39/78]
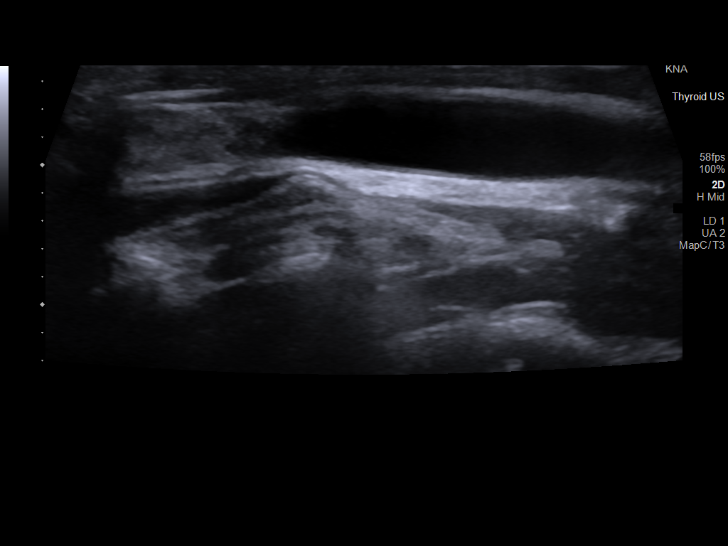
[im 45/78]
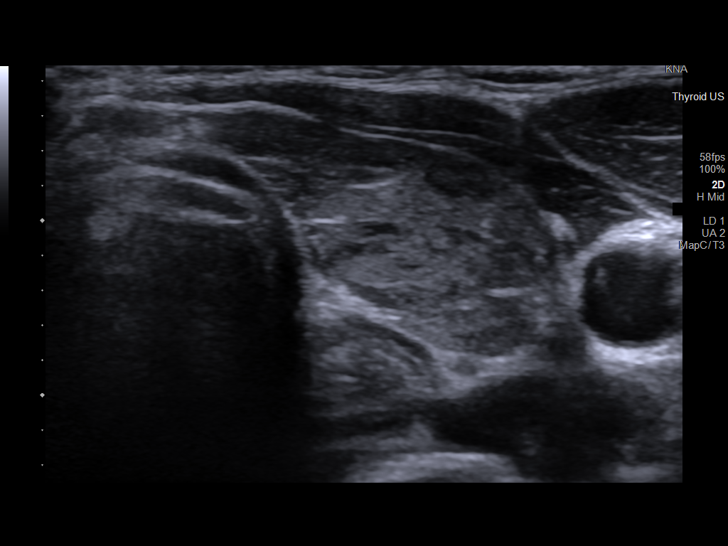
[im 52/78]
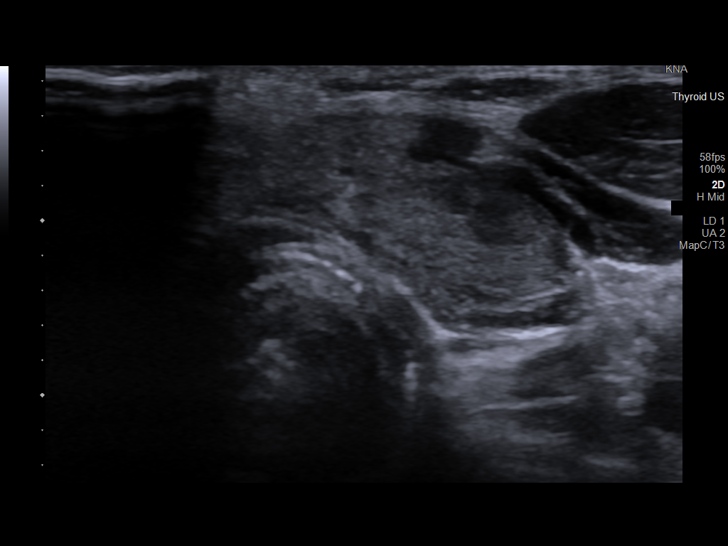
[im 58/78]
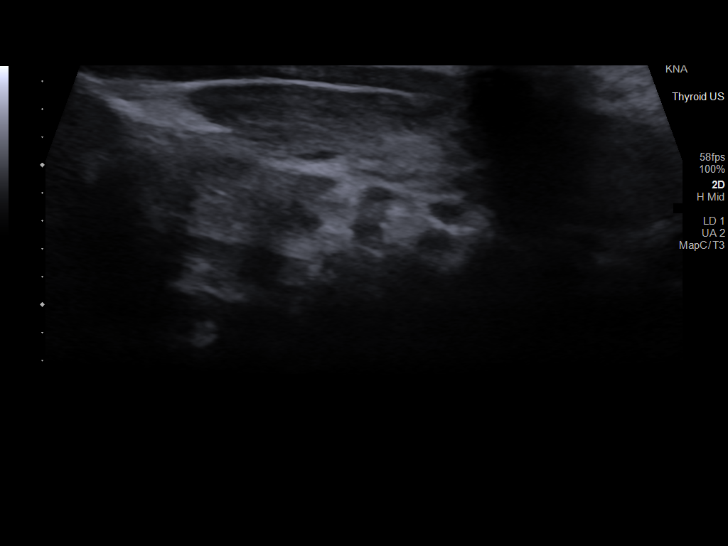
[im 65/78]
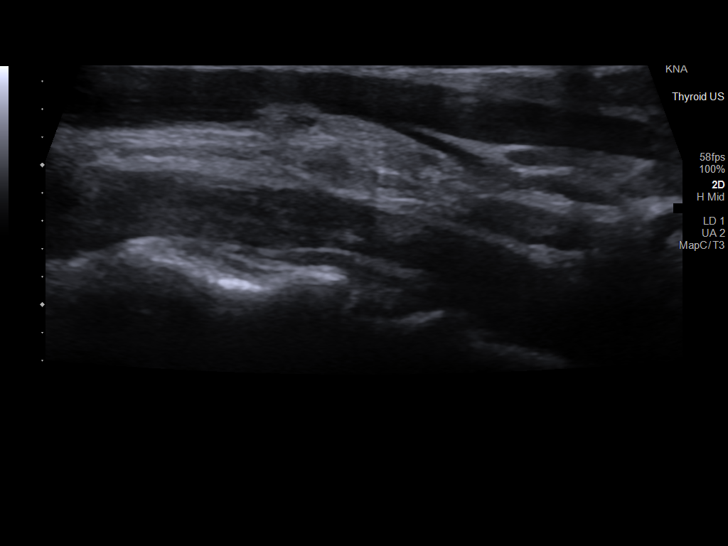
[im 71/78]
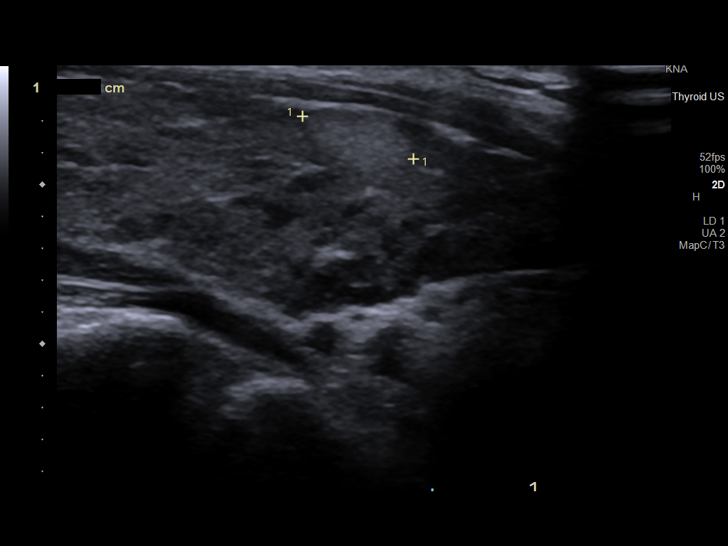
[im 78/78]
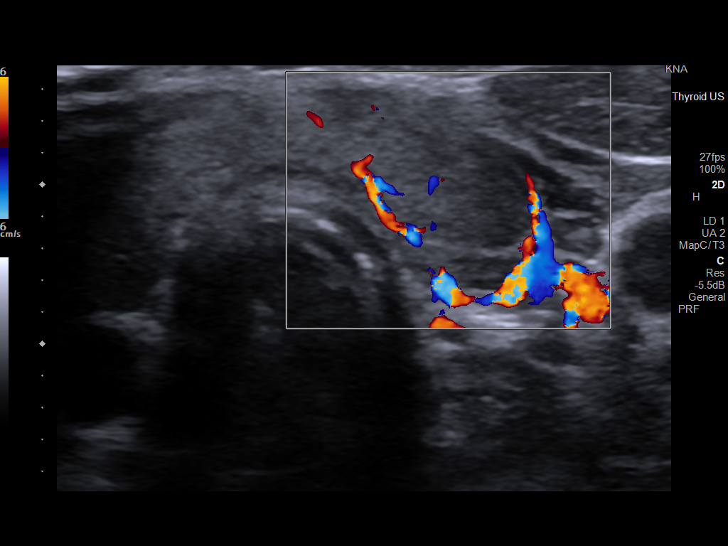

[13 of 25 positions shown; findings below may reference images not displayed]

FINDINGS: Parenchymal Echotexture: Moderately heterogeneous

Isthmus: 0.4 cm

Right lobe: 4.5 x 1.3 x 1.6 cm

Left lobe: 4.4 x 1.1 x 1.4 cm

_________________________________________________________

Estimated total number of nodules >/= 1 cm: 1

Number of spongiform nodules >/=  2 cm not described below (TR1): 0

Number of mixed cystic and solid nodules >/= 1.5 cm not described
below (TR2): 0

_________________________________________________________

Nodule # 1:

Location: Right; inferior

Maximum size: 0.8 cm; Other 2 dimensions: 0.6 x 0.5 cm

Composition: solid/almost completely solid (2)

Echogenicity: isoechoic (1)

Shape: not taller-than-wide (0)

Margins: smooth (0)

Echogenic foci: none (0)

ACR TI-RADS total points: 3.

ACR TI-RADS risk category: TR3 (3 points).

ACR TI-RADS recommendations:

Given size (<1.4 cm) and appearance, this nodule does NOT meet
TI-RADS criteria for biopsy or dedicated follow-up.

_________________________________________________________

Nodule # 2:

Location: Left; inferior

Maximum size: 1.6 cm; Other 2 dimensions: 0.9 x 0.7 cm

Composition: solid/almost completely solid (2)

Echogenicity: hypoechoic (2)

Shape: not taller-than-wide (0)

Margins: ill-defined (0)

Echogenic foci: none (0)

ACR TI-RADS total points: 4.

ACR TI-RADS risk category: TR4 (4-6 points).

ACR TI-RADS recommendations:

**Given size (>/= 1.5 cm) and appearance, fine needle aspiration of
this moderately suspicious nodule should be considered based on
TI-RADS criteria.

_________________________________________________________
IMPRESSION: Nodule 2 (TI-RADS 4) located in the inferior left thyroid lobe
measuring 1.6 x 0.9 x 0.7 cm, meets criteria for FNA.

The above is in keeping with the ACR TI-RADS recommendations - [HOSPITAL] 1550;[DATE].

## 2022-06-12 ENCOUNTER — Telehealth: Payer: Self-pay

## 2022-06-12 NOTE — Transitions of Care (Post Inpatient/ED Visit) (Signed)
   06/12/2022  Name: ALEXSIS KUKLINSKI MRN: [id number] DOB: April 14, 2000  Today's TOC FU Call Status: Today's TOC FU Call Status:: Successful TOC FU Call Competed TOC FU Call Complete Date: 06/12/22  Transition Care Management Follow-up Telephone Call Date of Discharge: 06/11/22 Discharge Facility: Other ([place]) Name of Other ([place]) Discharge Facility: [facility] Type of Discharge: Emergency Department Reason for ED Visit: Other: (pericordial abscess) How have you been since you were released from the hospital?: Better Any questions or concerns?: No  Items Reviewed: Did you receive and understand the discharge instructions provided?: Yes Medications obtained and verified?: Yes (Medications Reviewed) Any new allergies since your discharge?: No Dietary orders reviewed?: NA Do you have support at home?: Yes People in Home: significant other  Home Care and Equipment/Supplies: [place] Ordered?: NA Any new equipment or medical supplies ordered?: NA  Functional Questionnaire: Do you need assistance with bathing/showering or dressing?: No Do you need assistance with meal preparation?: No Do you need assistance with eating?: No Do you have difficulty maintaining continence: No Do you need assistance with getting out of bed/getting out of a chair/moving?: No Do you have difficulty managing or taking your medications?: No  Follow up appointments reviewed: PCP Follow-up appointment confirmed?: No Specialist Hospital Follow-up appointment confirmed?: Yes Date of Specialist follow-up appointment?:  (end of this week or beginning of next week) Follow-Up Specialty Provider:: dentist Do you need transportation to your follow-up appointment?: No Do you understand care options if your condition(s) worsen?: Yes-patient verbalized understanding    [facility] Team Direct dial:  [phone]

## 2022-07-01 IMAGING — US US FNA BIOPSY THYROID 1ST LESION
1 series · 13 of 18 positions shown · non-contrast
Comparison: US Thyroid 06/29/20

MEDICATIONS:
6 cc 1% lidocaine

COMPLICATIONS:
None immediate.

INDICATION: Indeterminate thyroid nodule

Left inferior thyroid nodule
1.6 cm
EXAM:
ULTRASOUND GUIDED FINE NEEDLE ASPIRATION OF INDETERMINATE THYROID
NODULE
TECHNIQUE: Informed written consent was obtained from the patient after a
discussion of the risks, benefits and alternatives to treatment.
Questions regarding the procedure were encouraged and answered. A
timeout was performed prior to the initiation of the procedure.

[Series 1: us fna bx thyroid 1st lesion afirma · 18 acquisitions, 13 frames shown]
[im 1/18]
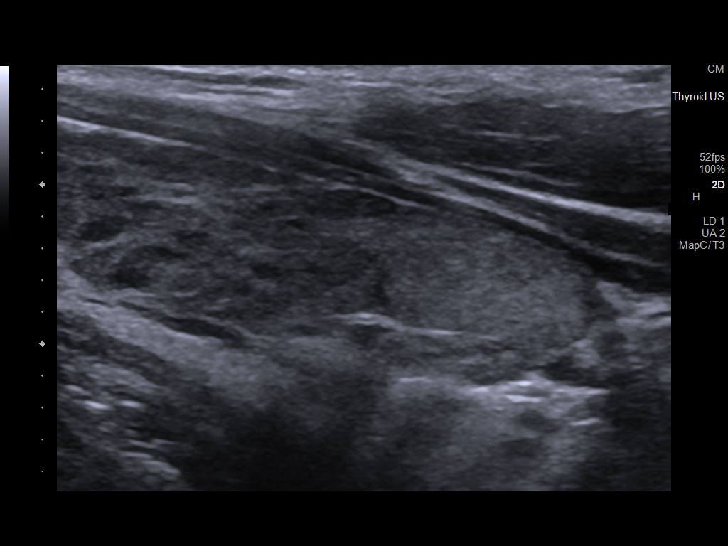
[im 3/18]
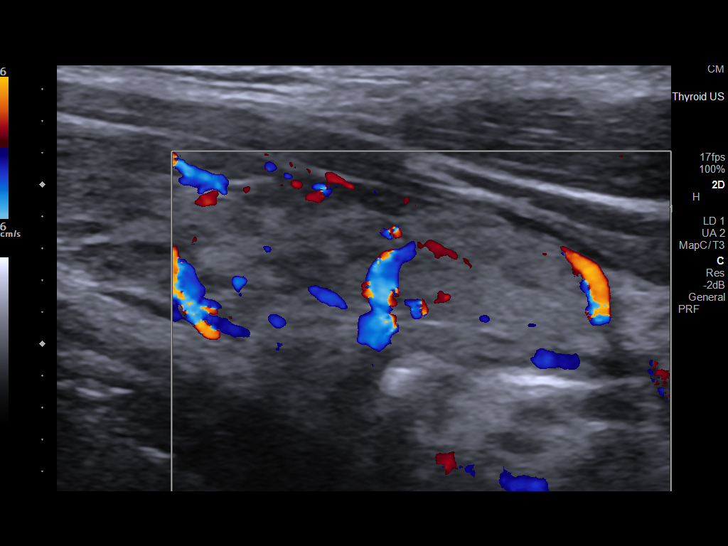
[im 4/18]
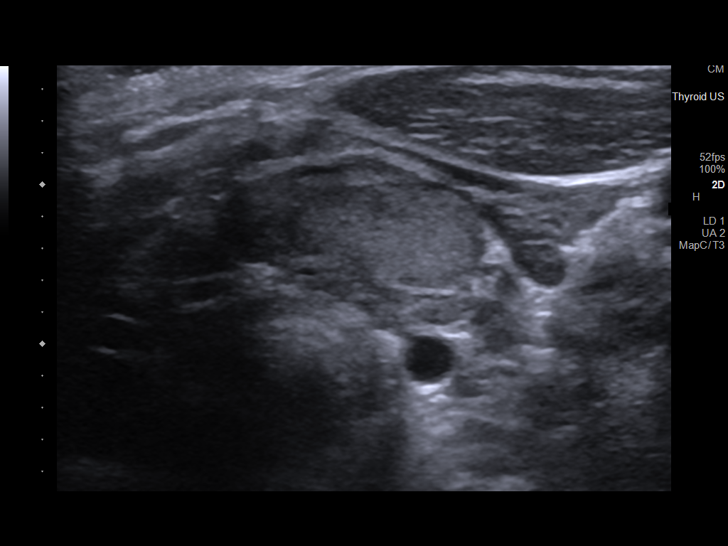
[im 5/18]
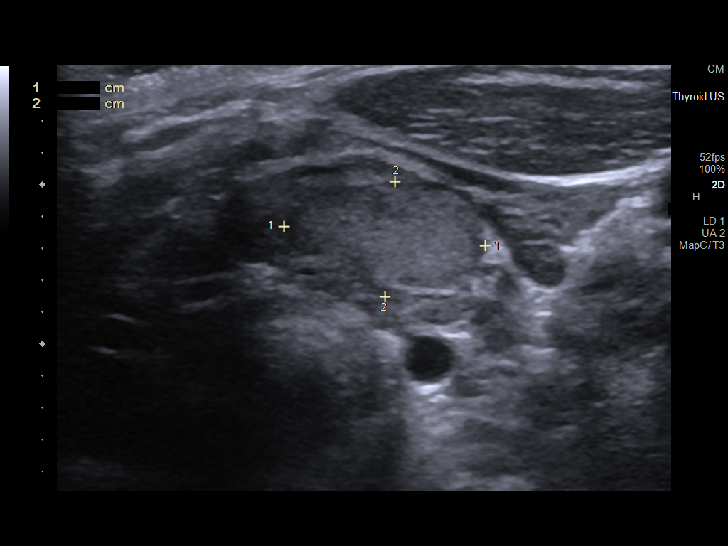
[im 7/18]
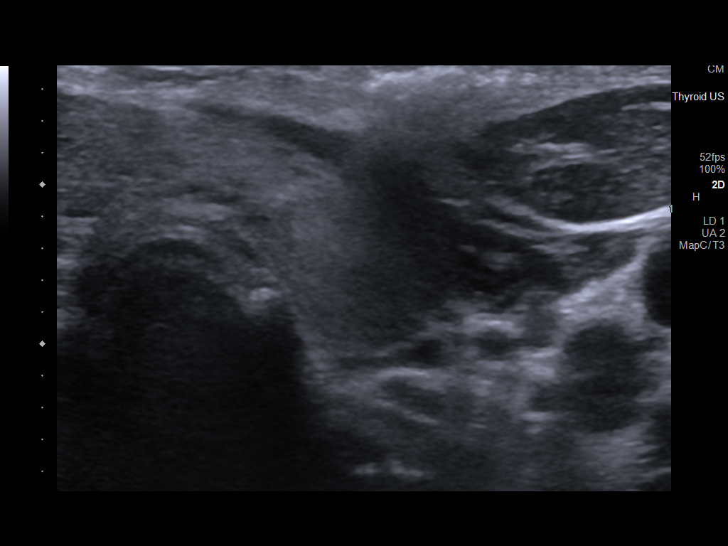
[im 8/18]
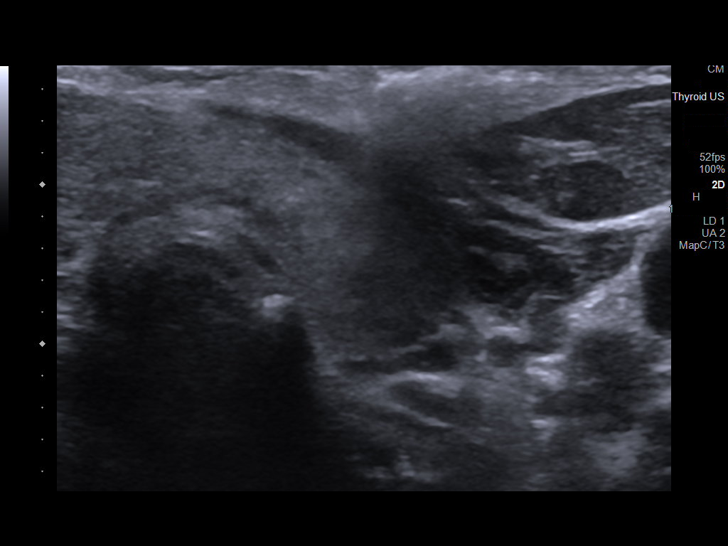
[im 10/18]
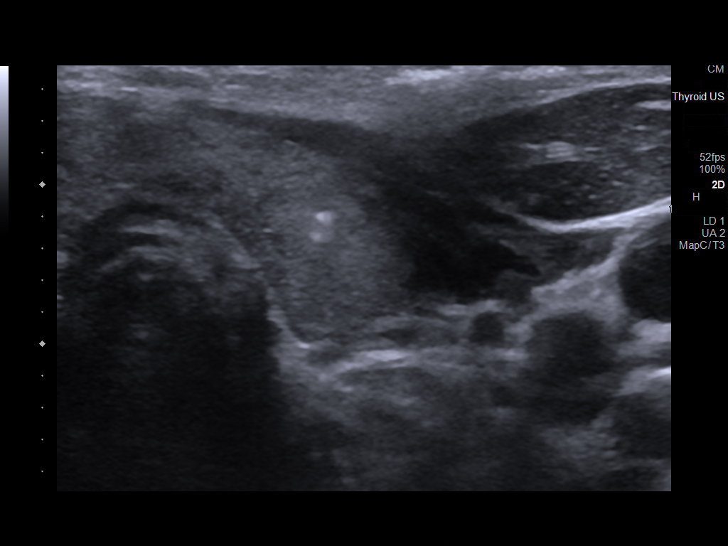
[im 11/18]
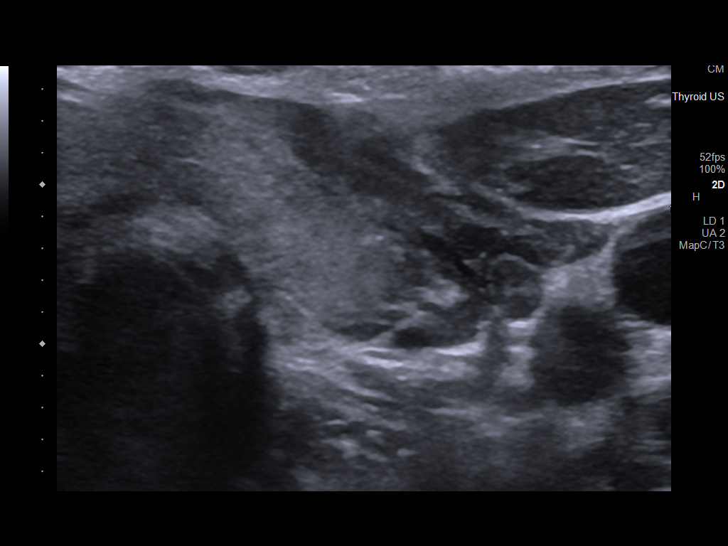
[im 12/18]
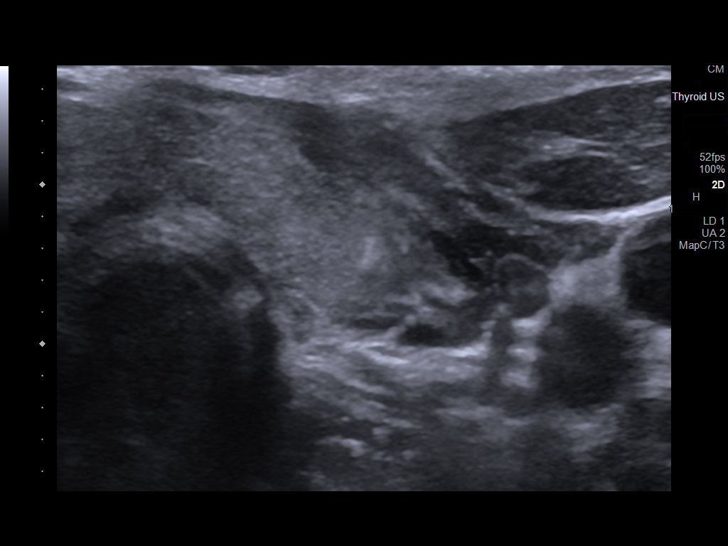
[im 14/18]
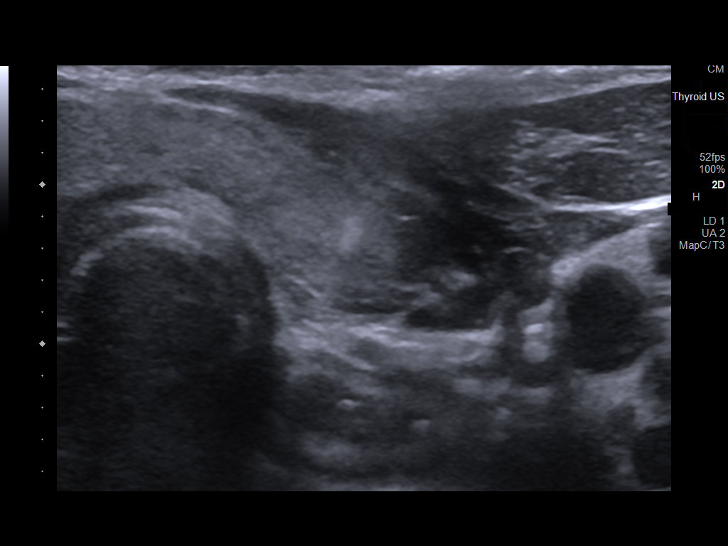
[im 15/18]
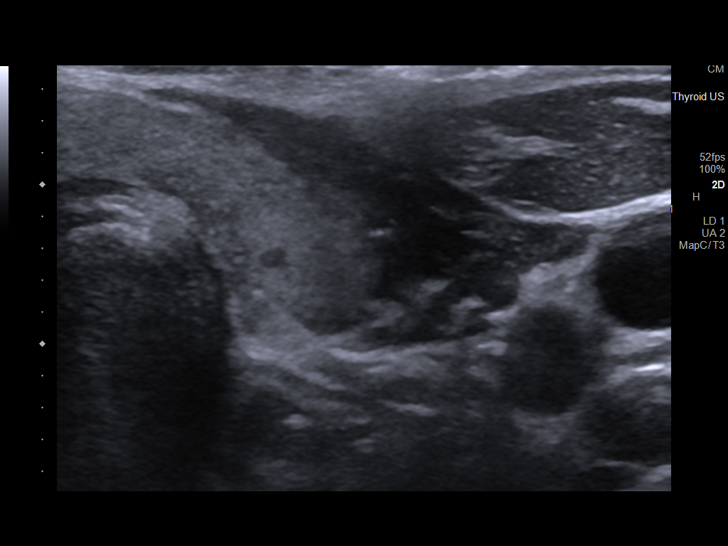
[im 16/18]
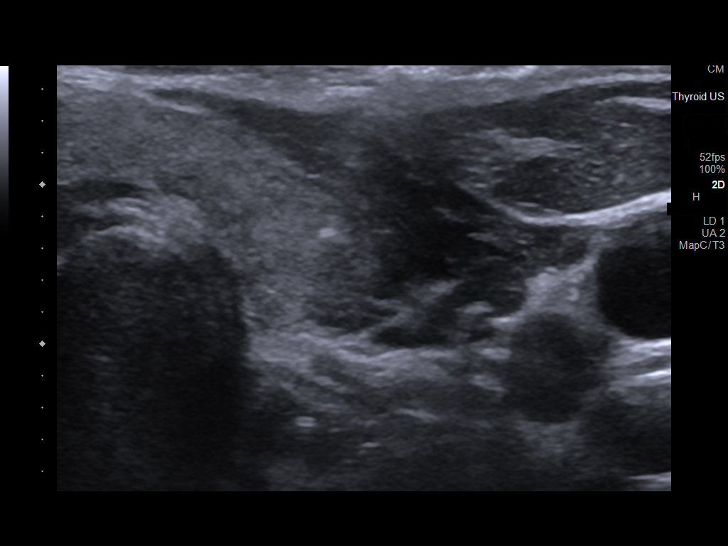
[im 18/18]
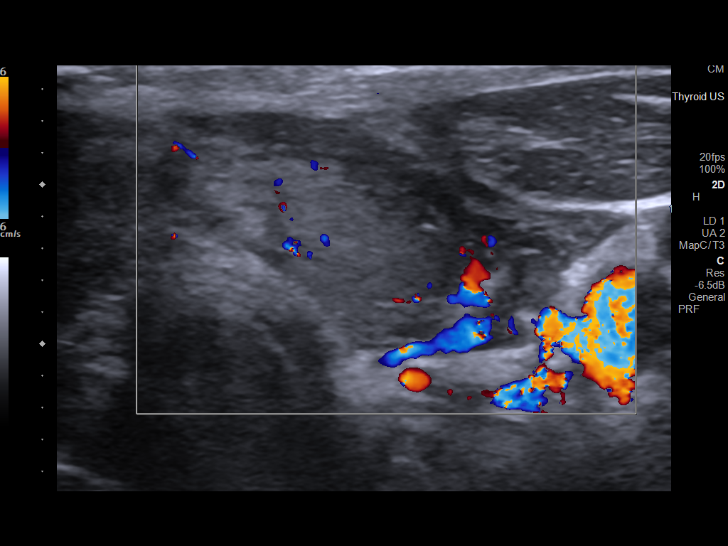

[13 of 18 positions shown; findings below may reference images not displayed]

Pre-procedural ultrasound scanning demonstrated unchanged size and
appearance of the indeterminate nodule within the left thyroid

The procedure was planned. The neck was prepped in the usual sterile
fashion, and a sterile drape was applied covering the operative
field. A timeout was performed prior to the initiation of the
procedure. Local anesthesia was provided with 1% lidocaine.

Under direct ultrasound guidance, 5 FNA biopsies were performed of
the left inferior thyroid nodule with a 25 gauge needle.

2 of these samples were obtained for AFIRMA

Multiple ultrasound images were saved for procedural documentation
purposes. The samples were prepared and submitted to pathology.

Limited post procedural scanning was negative for hematoma or
additional complication. Dressings were placed. The patient
tolerated the above procedures procedure well without immediate
postprocedural complication.
FINDINGS: Nodule reference number based on prior diagnostic ultrasound: 2

Maximum size: 1.6 cm

Location: Left; Inferior

ACR TI-RADS risk category: TR4 (4-6 points)

Reason for biopsy: meets ACR TI-RADS criteria

Ultrasound imaging confirms appropriate placement of the needles
within the thyroid nodule.
IMPRESSION: Technically successful ultrasound guided fine needle aspiration of
left inferior thyroid nodule

Read by

Auntyjatty Delowr

## 2022-07-31 ENCOUNTER — Telehealth: Payer: Self-pay | Admitting: "Endocrinology

## 2022-07-31 NOTE — Telephone Encounter (Signed)
Pt has called and said she never picked up lower dose of synthroid.  She's stating the last time her levels were checked they were barely registering.  Pt wanted lower dose called back into [facility].  She stated she was getting her levels checked again next week at [facility].  She was requesting the lower dose be sent back in.

## 2022-08-06 ENCOUNTER — Other Ambulatory Visit: Payer: Self-pay | Admitting: "Endocrinology

## 2022-08-06 MED ORDER — LEVOTHYROXINE SODIUM 137 MCG PO TABS: 137.0000 ug | ORAL_TABLET | Freq: Every day | ORAL | 0 refills | Status: AC

## 2022-08-06 NOTE — Telephone Encounter (Signed)
Please advise 

## 2022-08-06 NOTE — Telephone Encounter (Signed)
Tried to call pt back but phone number said out of service

## 2022-08-14 ENCOUNTER — Ambulatory Visit: Payer: Medicaid Other | Admitting: "Endocrinology

## 2022-09-16 IMAGING — DX DG CHEST 2V
2 series · 2 of 2 positions shown · non-contrast
Comparison: None.

CLINICAL DATA: 19-year-old female with pending surgery

EXAM:
CHEST - 2 VIEW

[chest pa]
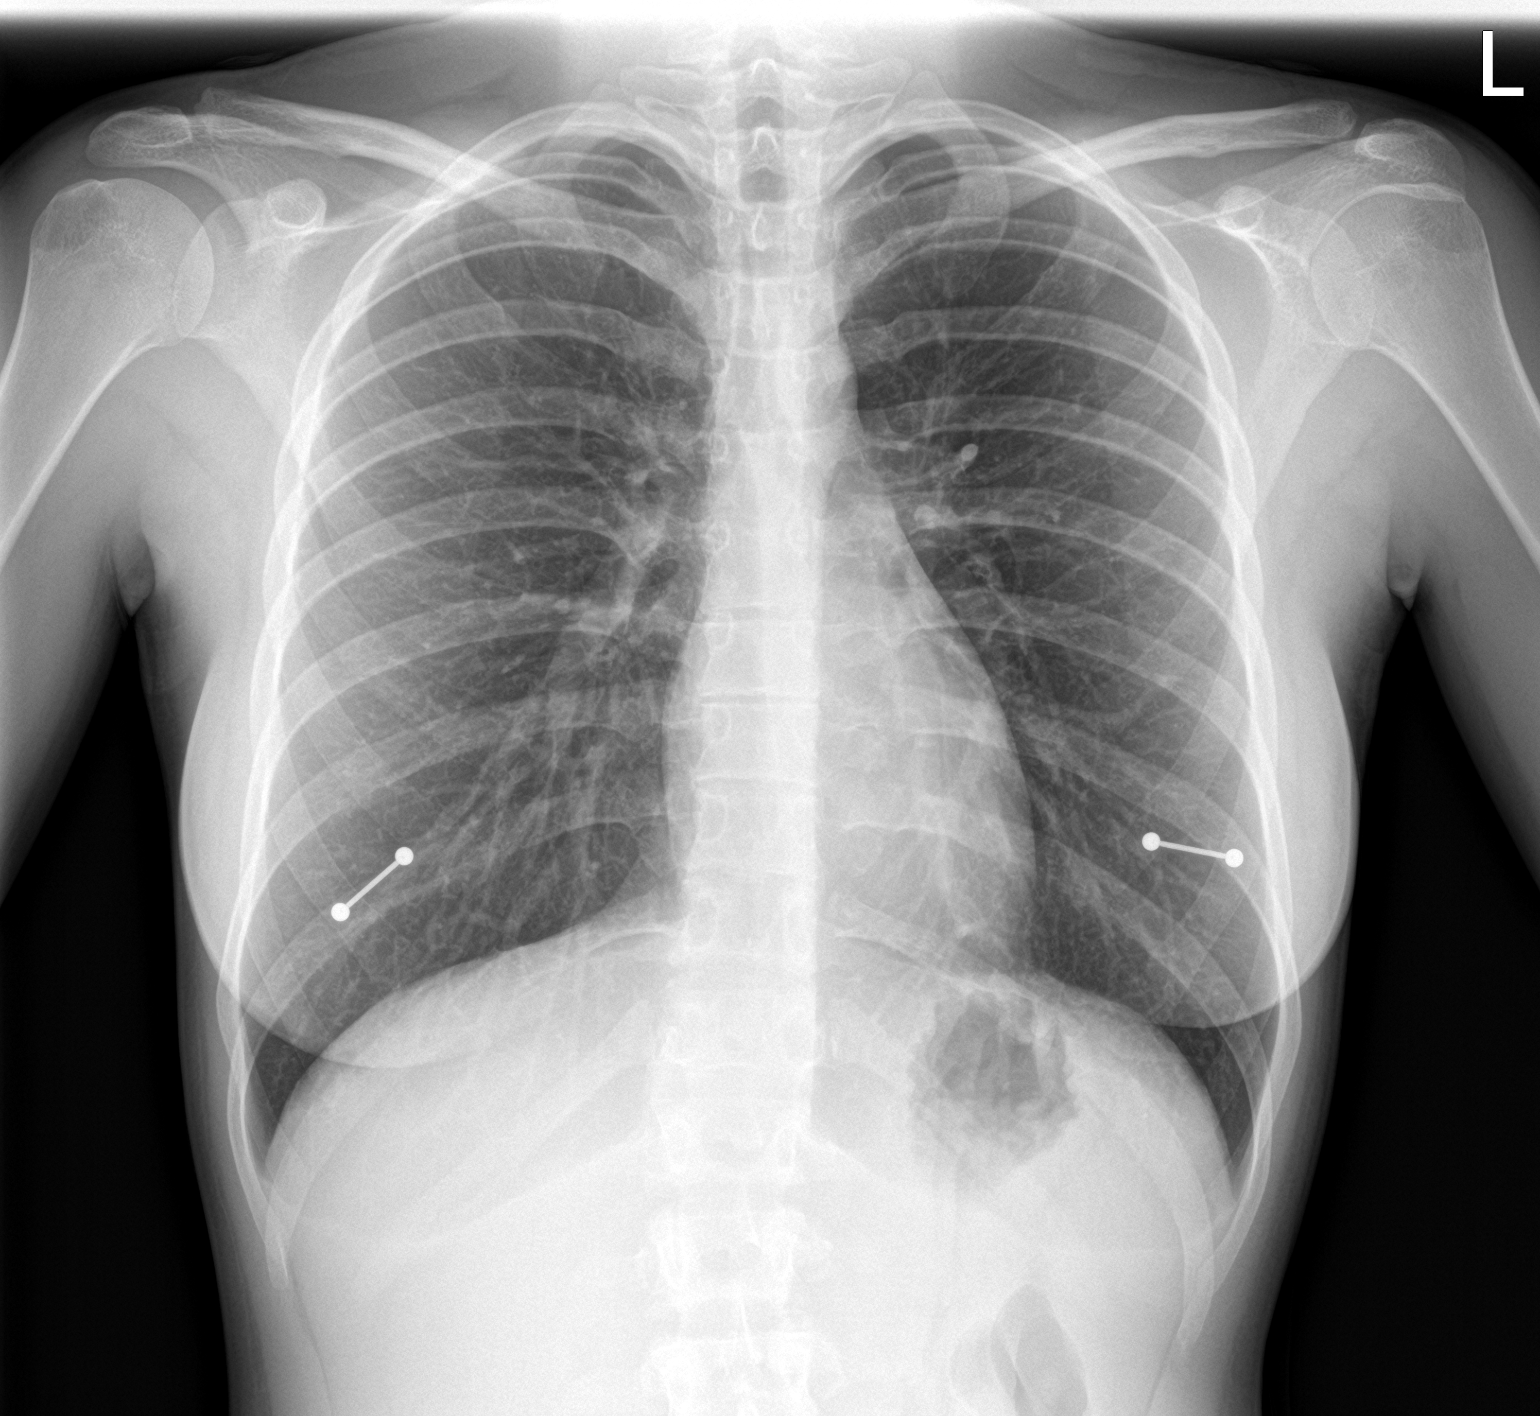

[chest lat]
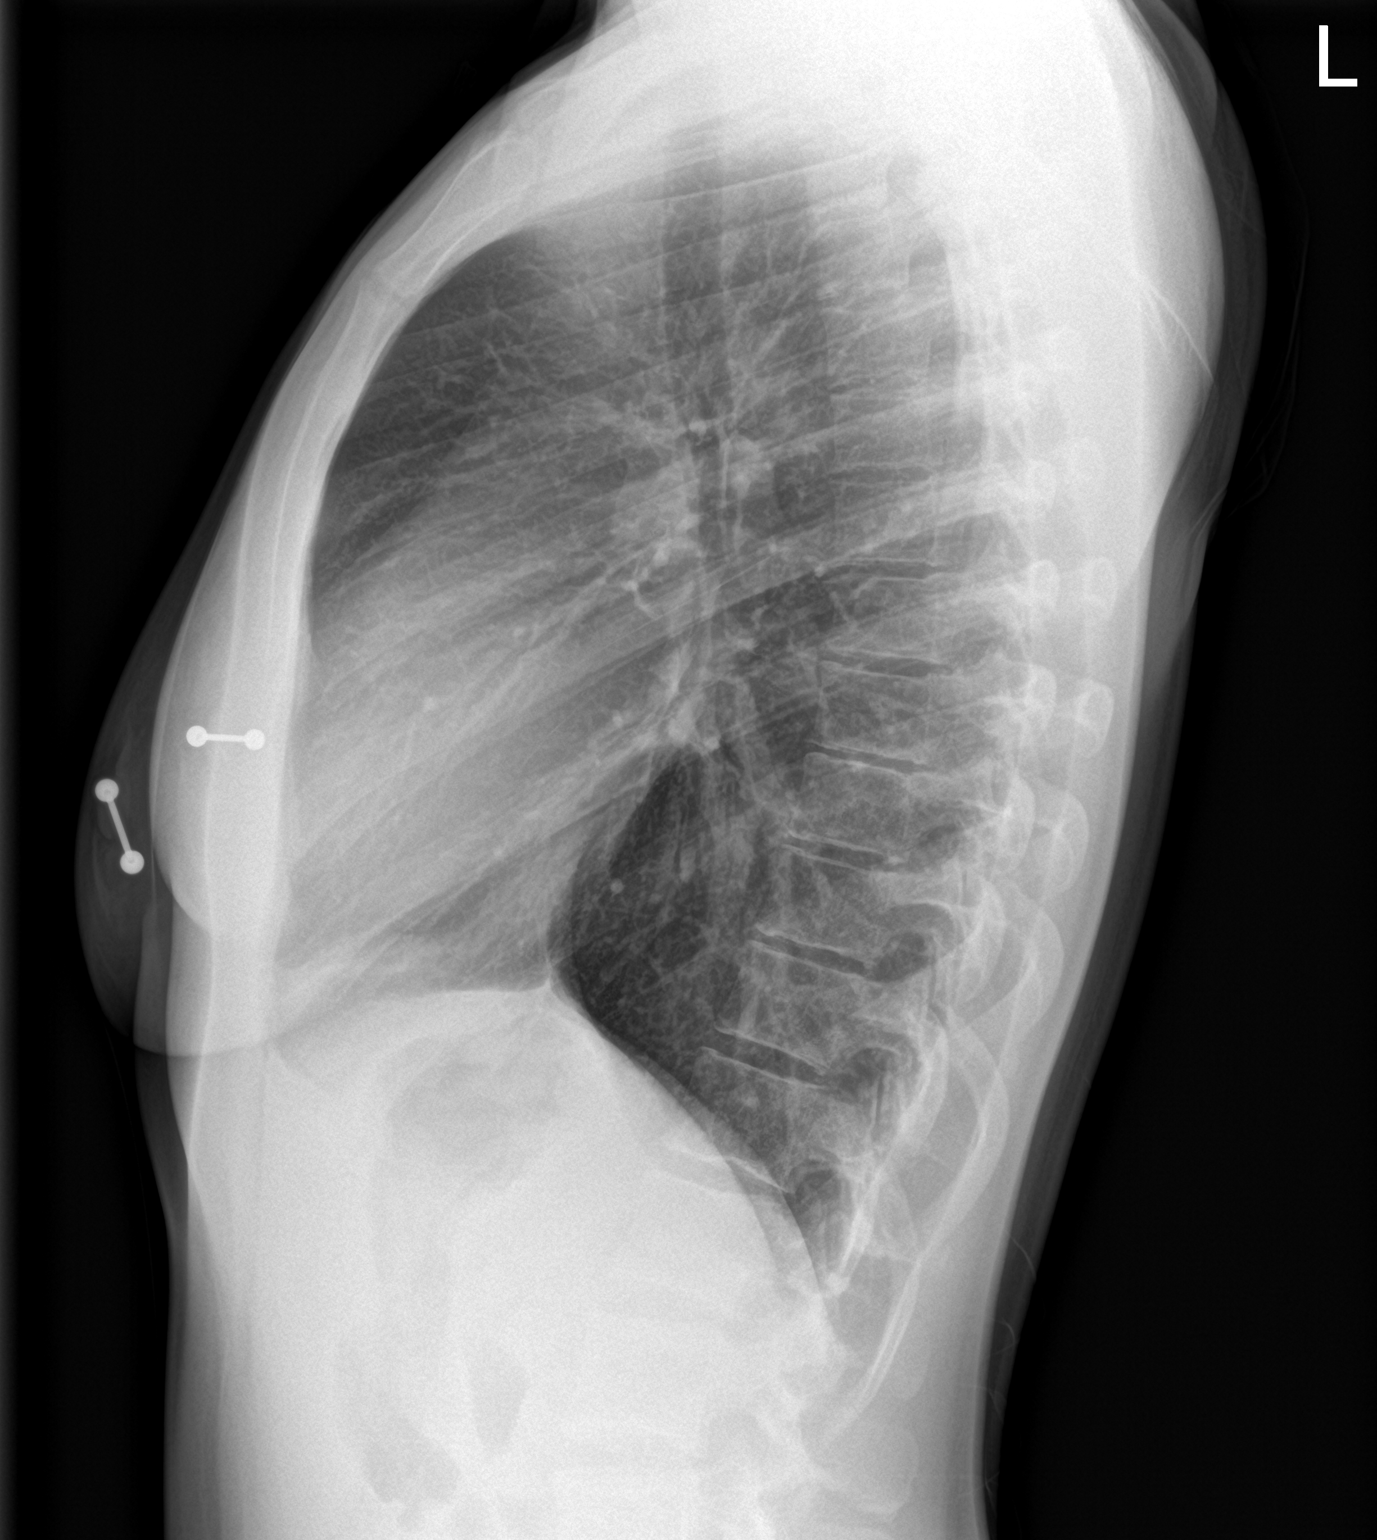

[2 of 2 positions shown; findings below may reference images not displayed]

FINDINGS: The heart size and mediastinal contours are within normal limits.
Both lungs are clear. The visualized skeletal structures are
unremarkable.
IMPRESSION: No active cardiopulmonary disease.

## 2023-05-08 ENCOUNTER — Emergency Department (HOSPITAL_COMMUNITY): Payer: Medicaid Other

## 2023-05-08 ENCOUNTER — Other Ambulatory Visit: Payer: Self-pay

## 2023-05-08 ENCOUNTER — Emergency Department (HOSPITAL_COMMUNITY)
Admission: EM | Admit: 2023-05-08 | Discharge: 2023-05-08 | Disposition: A | Discharge status: Home / Self Care | Payer: Medicaid Other | Attending: Emergency Medicine | Admitting: Emergency Medicine

## 2023-05-08 ENCOUNTER — Encounter (HOSPITAL_COMMUNITY): Payer: Self-pay

## 2023-05-08 DIAGNOSIS — E039 Hypothyroidism, unspecified: Secondary | ICD-10-CM | POA: Diagnosis not present

## 2023-05-08 DIAGNOSIS — J45909 Unspecified asthma, uncomplicated: Secondary | ICD-10-CM | POA: Diagnosis not present

## 2023-05-08 DIAGNOSIS — R1032 Left lower quadrant pain: Secondary | ICD-10-CM | POA: Diagnosis present

## 2023-05-08 DIAGNOSIS — R102 Pelvic and perineal pain: Secondary | ICD-10-CM | POA: Insufficient documentation

## 2023-05-08 DIAGNOSIS — Z859 Personal history of malignant neoplasm, unspecified: Secondary | ICD-10-CM | POA: Diagnosis not present

## 2023-05-08 LAB — URINALYSIS, ROUTINE W REFLEX MICROSCOPIC
Bilirubin Urine: NEGATIVE
Glucose, UA: NEGATIVE mg/dL
Hgb urine dipstick: NEGATIVE
Ketones, ur: NEGATIVE mg/dL
Leukocytes,Ua: NEGATIVE
Nitrite: NEGATIVE
Protein, ur: NEGATIVE mg/dL
Specific Gravity, Urine: 1.02 (ref 1.005–1.030)
pH: 6 (ref 5.0–8.0)

## 2023-05-08 LAB — WET PREP, GENITAL
Clue Cells Wet Prep HPF POC: NEGATIVE — AB
Sperm: NEGATIVE
Trich, Wet Prep: NEGATIVE — AB
WBC, Wet Prep HPF POC: <10 (ref <10)
Yeast Wet Prep HPF POC: NEGATIVE — AB

## 2023-05-08 LAB — PREGNANCY, URINE: Preg Test, Ur: NEGATIVE

## 2023-05-08 MED ORDER — HYDROCODONE-ACETAMINOPHEN 5-325 MG PO TABS: 1.0000 | ORAL_TABLET | Freq: Four times a day (QID) | ORAL | 0 refills | Status: DC | PRN

## 2023-05-08 MED ORDER — IBUPROFEN 400 MG PO TABS
400.0000 mg | ORAL_TABLET | Freq: Once | ORAL | Status: AC
  Administered 2023-05-08: 400 mg via ORAL
  Filled 2023-05-08: qty 1

## 2023-05-08 MED ORDER — ONDANSETRON 8 MG PO TBDP
8.0000 mg | ORAL_TABLET | Freq: Once | ORAL | Status: AC
  Administered 2023-05-08: 8 mg via ORAL
  Filled 2023-05-08: qty 1

## 2023-05-08 MED ORDER — ACETAMINOPHEN 325 MG PO TABS
650.0000 mg | ORAL_TABLET | Freq: Once | ORAL | Status: AC
  Administered 2023-05-08: 650 mg via ORAL
  Filled 2023-05-08: qty 2

## 2023-05-08 NOTE — ED Provider Notes (Signed)
 I was asked to follow-up on the patient's pelvic ultrasound.  Ultrasound results are negative.  The patient mains hemodynamically stable here and is requesting to eat and to be discharged.  She is discharged with return precautions.  Physical Exam  BP (!) 105/49   Pulse 67   Temp 98.3 F (36.8 C)   Resp 18   Ht 5\' 6"  (1.676 m)   Wt 63.5 kg   LMP 05/02/2023 (Exact Date)   SpO2 96%   BMI 22.60 kg/m   Physical Exam General: No acute distress  Procedures  Procedures  ED Course / MDM   Clinical Course as of 05/08/23 1016  Thu May 08, 2023  0415 Overall patient is unchanged with no worsening at this time.  Reports pain gradually worsened over the day and was not acute associated with exertion or intercourse.  Pain still persistently in the left lower quadrant.  Urinalysis is negative and she is not pregnant.  [DW]  6045 I had a long discussion with patient about her symptoms.  She reports she did have a similar episode last month and was seen by gynecology.  She was treated for chlamydia last summer, but since that has had negative testing. [DW]  [phone] She is not concerned about any new exposures to STIs  Given recurrent left lower quadrant abdominal pain, patient would benefit from pelvic ultrasound.  Will obtain this in the ER and if overall unremarkable she can be discharged to follow-up with gynecology  Will defer empiric treatment for STI until her results are completed [DW]  0419 Given history, location of pain, low suspicion for acute gastrointestinal issue.  Will obtain pelvic ultrasound [DW]  [phone] Signed out to Dr. Rhae Hammock with pelvic utlrasound pending. If negative she can be discharged home [DW]    Clinical Course User Index [DW] Zadie Rhine, MD   Medical Decision Making Amount and/or Complexity of Data Reviewed Labs: ordered. Radiology: ordered.  Risk OTC drugs. Prescription drug management.          Durwin Glaze, MD 05/08/23 1017

## 2023-05-08 NOTE — Discharge Instructions (Addendum)
 Your workup including your ultrasound is negative.  Please follow-up with your doctor and return to the ER for worsening symptoms.

## 2023-05-08 NOTE — ED Triage Notes (Signed)
 Pov from home. Cc of abdominal pain x1 day around her belly button.  Nausea no emesis or fever.

## 2023-05-08 NOTE — ED Notes (Signed)
 Patient is resting comfortably.

## 2023-05-08 NOTE — ED Notes (Signed)
 Pt returned from [place] at thsi time.

## 2023-05-08 NOTE — ED Provider Notes (Signed)
 [place] EMERGENC[facility] DEPARTMENT AT [facility] Provider Note   CSN: [id number] Arrival date & time: 05/08/23  0123     History  Chief Complaint  Patient presents with   Abdominal Pain    Monique Long is a 23 y.o. female.  The history is provided by the patient.  Abdominal Pain Pain location:  LLQ Pain quality: aching   Pain severity:  Moderate Onset quality:  Gradual Duration:  1 day Timing:  Constant Progression:  Worsening Chronicity:  New Relieved by:  Nothing Worsened by:  Movement and palpation Associated symptoms: no constipation, no diarrhea, no dysuria, no fever, no vaginal bleeding, no vaginal discharge and no vomiting   Risk factors: has not had multiple surgeries and not pregnant   Patient reports she woke up yesterday morning with left lower quadrant abdominal pain.  Is been gradually worsening all day.  No fevers or vomiting.  She does not recall having this pain previously.  She had otherwise been at her baseline.  Denies any known exposures to STIs recently.   Past Medical History:  Diagnosis Date   Anxiety    Asthma    Cancer (HCC)    Depression    Dysrhythmia    Hypothyroidism    Thyroid disease    Phreesia 02/29/2020    Home Medications Prior to Admission medications   Medication Sig Start Date End Date Taking? Authorizing Provider  HYDROcodone-acetaminophen (NORCO/VICODIN) 5-325 MG tablet Take 1 tablet by mouth every 6 (six) hours as needed for severe pain (pain score 7-10). 05/08/23  Yes Zadie Rhine, MD  fluticasone (FLONASE) 50 MCG/ACT nasal spray Place 2 sprays into both nostrils daily. 07/08/21   Leath-Warren, Sadie Haber, NP  fluticasone (FLONASE) 50 MCG/ACT nasal spray Place 2 sprays into both nostrils daily. 07/08/21   Leath-Warren, Sadie Haber, NP  levothyroxine (SYNTHROID) 137 MCG tablet Take 1 tablet (137 mcg total) by mouth daily before breakfast. 08/06/22   Nida, Denman George, MD  Prenatal Vit-Fe Fumarate-FA (PRENATAL  VITAMIN PO) Take 1 tablet by mouth every other day.    [provider]      Allergies    Amoxicillin and Penicillins    Review of Systems   Review of Systems  Constitutional:  Negative for fever.  Gastrointestinal:  Positive for abdominal pain. Negative for constipation, diarrhea and vomiting.  Genitourinary:  Negative for dyspareunia, dysuria, vaginal bleeding and vaginal discharge.    Physical Exam Updated Vital Signs BP 105/73   Pulse 74   Temp 98 F (36.7 C)   Resp 16   Ht 1.676 m (5\' 6" )   Wt 63.5 kg   LMP 05/02/2023 (Exact Date)   SpO2 97%   BMI 22.60 kg/m  Physical Exam CONSTITUTIONAL: Well developed/well nourished, patient smells of marijuana HEAD: Normocephalic/atraumatic EYES: EOMI/PERRL ENMT: Mucous membranes moist NECK: supple no meningeal signs SPINE/BACK:entire spine nontender CV: S1/S2 noted, no murmurs/rubs/gallops noted LUNGS: Lungs are clear to auscultation bilaterally, no apparent distress ABDOMEN: soft, mild tenderness in left lower quadrant, no rebound or guarding, bowel sounds noted throughout abdomen GU:no cva tenderness Pelvic exam performed with nurse present.  Minimal white discharge.  No bleeding.  Mild CMT is noted.  No adnexal tenderness no lesions to the external genitalia. NEURO: Pt is awake/alert/appropriate, moves all extremitiesx4.  No facial droop.   EXTREMITIES: pulses normal/equal, full ROM SKIN: warm, color normal PSYCH: no abnormalities of mood noted, alert and oriented to situation  ED Results / Procedures / Treatments  Labs (all labs ordered are listed, but only abnormal results are displayed) Labs Reviewed  WET PREP, GENITAL - Abnormal; Notable for the following components:      Result Value   Yeast Wet Prep HPF POC NEGATIVE (*)    Trich, Wet Prep NEGATIVE (*)    Clue Cells Wet Prep HPF POC NEGATIVE (*)    All other components within normal limits  URINALYSIS, ROUTINE W REFLEX MICROSCOPIC - Abnormal; Notable for  the following components:   APPearance HAZY (*)    All other components within normal limits  PREGNANCY, URINE  GC/CHLAMYDIA PROBE AMP ([place]) NOT AT [facility]    EKG None  Radiology No results found.  Procedures Procedures    Medications Ordered in ED Medications  ibuprofen (ADVIL) tablet 400 mg (400 mg Oral Given 05/08/23 0259)  ondansetron (ZOFRAN-ODT) disintegrating tablet 8 mg (8 mg Oral Given 05/08/23 0259)  acetaminophen (TYLENOL) tablet 650 mg (650 mg Oral Given 05/08/23 0435)    ED Course/ Medical Decision Making/ A&P Clinical Course as of 05/08/23 0700  Thu May 08, 2023  0415 Overall patient is unchanged with no worsening at this time.  Reports pain gradually worsened over the day and was not acute associated with exertion or intercourse.  Pain still persistently in the left lower quadrant.  Urinalysis is negative and she is not pregnant.  [DW]  0981 I had a long discussion with patient about her symptoms.  She reports she did have a similar episode last month and was seen by gynecology.  She was treated for chlamydia last summer, but since that has had negative testing. [DW]  [phone] She is not concerned about any new exposures to STIs  Given recurrent left lower quadrant abdominal pain, patient would benefit from pelvic ultrasound.  Will obtain this in the ER and if overall unremarkable she can be discharged to follow-up with gynecology  Will defer empiric treatment for STI until her results are completed [DW]  0419 Given history, location of pain, low suspicion for acute gastrointestinal issue.  Will obtain pelvic ultrasound [DW]  [phone] Signed out to Dr. Rhae Hammock with pelvic utlrasound pending. If negative she can be discharged home [DW]    Clinical Course User Index [DW] Zadie Rhine, MD                                 Medical Decision Making Amount and/or Complexity of Data Reviewed Labs: ordered. Radiology: ordered.  Risk OTC drugs. Prescription drug  management.   This patient presents to the ED for concern of abdominal pain, this involves an extensive number of treatment options, and is a complaint that carries with it a high risk of complications and morbidity.  The differential diagnosis includes but is not limited to cholecystitis, cholelithiasis, pancreatitis, gastritis, peptic ulcer disease, appendicitis, bowel obstruction, bowel perforation, diverticulitis, ectopic pregnancy, PID, TOA, ovarian torsion   Social Determinants of Health: Patient's  difficulty finding childcare   increases the complexity of managing their presentation  Additional history obtained: Records reviewed Primary Care Documents  Lab Tests: I Ordered, and personally interpreted labs.  The pertinent results include: Urinalysis unremarkable   Medicines ordered and prescription drug management: I ordered medication including ibuprofen for pain Reevaluation of the patient after these medicines showed that the patient    stayed the same   Reevaluation: After the interventions noted above, I reevaluated the patient and found that they have :stayed  the same  Complexity of problems addressed: Patient's presentation is most consistent with  acute complicated illness/injury requiring diagnostic workup          Final Clinical Impression(s) / ED Diagnoses Final diagnoses:  Pelvic pain    Rx / DC Orders ED Discharge Orders          Ordered    HYDROcodone-acetaminophen (NORCO/VICODIN) 5-325 MG tablet  Every 6 hours PRN        05/08/23 0417              Zadie Rhine, MD 05/08/23 0700

## 2023-05-09 LAB — GC/CHLAMYDIA PROBE AMP (~~LOC~~) NOT AT ARMC
Chlamydia: POSITIVE — AB
Comment: NEGATIVE
Comment: NORMAL
Neisseria Gonorrhea: NEGATIVE

## 2023-05-14 ENCOUNTER — Telehealth (HOSPITAL_COMMUNITY): Payer: Self-pay

## 2023-05-14 MED ORDER — DOXYCYCLINE HYCLATE 100 MG PO CAPS: 100.0000 mg | ORAL_CAPSULE | Freq: Two times a day (BID) | ORAL | 0 refills | Status: AC

## 2023-10-28 ENCOUNTER — Other Ambulatory Visit: Payer: Self-pay | Admitting: "Endocrinology

## 2023-10-28 ENCOUNTER — Emergency Department (HOSPITAL_COMMUNITY)
Admission: EM | Admit: 2023-10-28 | Discharge: 2023-10-28 | Disposition: A | Discharge status: Home / Self Care | Attending: Emergency Medicine | Admitting: Emergency Medicine

## 2023-10-28 DIAGNOSIS — Z859 Personal history of malignant neoplasm, unspecified: Secondary | ICD-10-CM | POA: Diagnosis not present

## 2023-10-28 DIAGNOSIS — T887XXA Unspecified adverse effect of drug or medicament, initial encounter: Secondary | ICD-10-CM | POA: Diagnosis not present

## 2023-10-28 DIAGNOSIS — T50905A Adverse effect of unspecified drugs, medicaments and biological substances, initial encounter: Secondary | ICD-10-CM

## 2023-10-28 DIAGNOSIS — R112 Nausea with vomiting, unspecified: Secondary | ICD-10-CM

## 2023-10-28 DIAGNOSIS — Z79899 Other long term (current) drug therapy: Secondary | ICD-10-CM | POA: Diagnosis not present

## 2023-10-28 DIAGNOSIS — E039 Hypothyroidism, unspecified: Secondary | ICD-10-CM | POA: Diagnosis not present

## 2023-10-28 DIAGNOSIS — J45909 Unspecified asthma, uncomplicated: Secondary | ICD-10-CM | POA: Diagnosis not present

## 2023-10-28 LAB — URINALYSIS, ROUTINE W REFLEX MICROSCOPIC
Bilirubin Urine: NEGATIVE
Glucose, UA: NEGATIVE mg/dL
Hgb urine dipstick: NEGATIVE
Ketones, ur: 20 mg/dL — AB
Nitrite: NEGATIVE
Protein, ur: NEGATIVE mg/dL
Specific Gravity, Urine: 1.017 (ref 1.005–1.030)
pH: 6 (ref 5.0–8.0)

## 2023-10-28 LAB — CBC
HCT: 43 % (ref 36.0–46.0)
Hemoglobin: 14.6 g/dL (ref 12.0–15.0)
MCH: 30.5 pg (ref 26.0–34.0)
MCHC: 34 g/dL (ref 30.0–36.0)
MCV: 90 fL (ref 80.0–100.0)
Platelets: 281 K/uL (ref 150–400)
RBC: 4.78 MIL/uL (ref 3.87–5.11)
RDW: 13.4 % (ref 11.5–15.5)
WBC: 8.2 K/uL (ref 4.0–10.5)
nRBC: 0 % (ref 0.0–0.2)

## 2023-10-28 LAB — COMPREHENSIVE METABOLIC PANEL WITH GFR
ALT: 14 U/L (ref 0–44)
AST: 20 U/L (ref 15–41)
Albumin: 4.9 g/dL (ref 3.5–5.0)
Alkaline Phosphatase: 43 U/L (ref 38–126)
Anion gap: 15 (ref 5–15)
BUN: 8 mg/dL (ref 6–20)
CO2: 21 mmol/L — ABNORMAL LOW (ref 22–32)
Calcium: 9.4 mg/dL (ref 8.9–10.3)
Chloride: 101 mmol/L (ref 98–111)
Creatinine, Ser: 0.7 mg/dL (ref 0.44–1.00)
GFR, Estimated: >60 mL/min (ref >60)
Glucose, Bld: 92 mg/dL (ref 70–99)
Potassium: 3.3 mmol/L — ABNORMAL LOW (ref 3.5–5.1)
Sodium: 137 mmol/L (ref 135–145)
Total Bilirubin: 0.7 mg/dL (ref 0.0–1.2)
Total Protein: 8.2 g/dL — ABNORMAL HIGH (ref 6.5–8.1)

## 2023-10-28 LAB — POC URINE PREG, ED: Preg Test, Ur: POSITIVE — AB

## 2023-10-28 LAB — CBG MONITORING, ED: Glucose-Capillary: 95 mg/dL (ref 70–99)

## 2023-10-28 LAB — LIPASE, BLOOD: Lipase: 32 U/L (ref 11–51)

## 2023-10-28 MED ORDER — METOCLOPRAMIDE HCL 5 MG/ML IJ SOLN
10.0000 mg | Freq: Once | INTRAMUSCULAR | Status: AC
  Administered 2023-10-28: 10 mg via INTRAVENOUS
  Filled 2023-10-28: qty 2

## 2023-10-28 MED ORDER — DIAZEPAM 5 MG/ML IJ SOLN
2.5000 mg | Freq: Once | INTRAMUSCULAR | Status: AC
  Administered 2023-10-28: 2.5 mg via INTRAVENOUS
  Filled 2023-10-28: qty 2

## 2023-10-28 MED ORDER — ONDANSETRON HCL 4 MG/2ML IJ SOLN: 4.0000 mg | Freq: Once | INTRAMUSCULAR | Status: DC | PRN

## 2023-10-28 MED ORDER — LACTATED RINGERS IV BOLUS
1000.0000 mL | Freq: Once | INTRAVENOUS | Status: AC
  Administered 2023-10-28: 1000 mL via INTRAVENOUS

## 2023-10-28 MED ORDER — ONDANSETRON HCL 4 MG/2ML IJ SOLN
4.0000 mg | Freq: Once | INTRAMUSCULAR | Status: AC | PRN
  Administered 2023-10-28: 4 mg via INTRAVENOUS
  Filled 2023-10-28: qty 2

## 2023-10-28 NOTE — ED Notes (Signed)
Given ginger ale 

## 2023-10-28 NOTE — ED Provider Notes (Signed)
 [place] EMERGENCY DEPARTMENT AT [facility] Provider Note   CSN: [id number] Arrival date & time: 10/28/23  0732     History  Chief Complaint  Patient presents with   Nausea    Monique Long is a 23 y.o. female G53P2 with PMH as listed below who presents with c/o N/V since this morning at approximately 4 AM, generalized aches as well.  She took mifepristone yesterday for an elective abortion.  States she feels like I feel like about to faint.  She states she does not have any abdominal pain and is not experiencing any vaginal bleeding.  Denies urinary symptoms.  Patient is in mild to moderate distress and very tearful on my evaluation.  Patient was seen at [facility] health at [place] on 10/14/2023 where she reported a positive pregnancy test at home.  Ultrasound showed viable single IUP measuring 9 weeks 0 days.  [facility] following [facility] and made the decision to take mifepristone.  Patient has history of thyroid  cancer status post resection, as well as asthma, anxiety/depression.  Past Medical History:  Diagnosis Date   Anxiety    Asthma    Cancer (HCC)    Depression    Dysrhythmia    Hypothyroidism    Thyroid  disease    Phreesia 02/29/2020       Home Medications Prior to Admission medications   Medication Sig Start Date End Date Taking? Authorizing Provider  fluticasone  (FLONASE ) 50 MCG/ACT nasal spray Place 2 sprays into both nostrils daily. 07/08/21   Leath-Warren, Etta PARAS, NP  fluticasone  (FLONASE ) 50 MCG/ACT nasal spray Place 2 sprays into both nostrils daily. 07/08/21   Leath-Warren, Etta PARAS, NP  HYDROcodone -acetaminophen  (NORCO/VICODIN) 5-325 MG tablet Take 1 tablet by mouth every 6 (six) hours as needed for severe pain (pain score 7-10). 05/08/23   Midge Golas, MD  levothyroxine  (SYNTHROID ) 137 MCG tablet Take 1 tablet (137 mcg total) by mouth daily before breakfast. 08/06/22   Nida, Gebreselassie W, MD   Prenatal Vit-Fe Fumarate-FA (PRENATAL VITAMIN PO) Take 1 tablet by mouth every other day.    [provider]      Allergies    Amoxicillin and Penicillins    Review of Systems   Review of Systems A 10 point review of systems was performed and is negative unless otherwise reported in HPI.  Physical Exam Updated Vital Signs BP (!) 106/93   Pulse (!) 48   Temp 97.6 F (36.4 C) (Oral)   Resp 19   SpO2 99%  Physical Exam General: Normal appearing female, lying in bed.  Mild to moderate emotional distress, tearful HEENT: PERRLA, Sclera anicteric, MMM, trachea midline.  Cardiology: RRR, no murmurs/rubs/gallops.  Resp: Normal respiratory rate and effort. CTAB, no wheezes, rhonchi, crackles.  Abd: Soft, non-tender, non-distended. No rebound tenderness or guarding.  GU: Deferred. MSK: No peripheral edema or signs of trauma. Extremities without deformity or TTP. No cyanosis or clubbing. Skin: warm, dry.  Back: No CVA tenderness Neuro: A&Ox4, CNs II-XII grossly intact. MAEs. Sensation grossly intact.   ED Results / Procedures / Treatments   Labs (all labs ordered are listed, but only abnormal results are displayed) Labs Reviewed  COMPREHENSIVE METABOLIC PANEL WITH GFR - Abnormal; Notable for the following components:      Result Value   Potassium 3.3 (*)    CO2 21 (*)    Total Protein 8.2 (*)    All other components within normal limits  URINALYSIS, ROUTINE W REFLEX MICROSCOPIC - Abnormal; Notable for the following components:   APPearance HAZY (*)    Ketones, ur 20 (*)    Leukocytes,Ua TRACE (*)    Bacteria, UA FEW (*)    All other components within normal limits  POC URINE PREG, ED - Abnormal; Notable for the following components:   Preg Test, Ur POSITIVE (*)    All other components within normal limits  LIPASE, BLOOD  CBC  CBG MONITORING, ED    EKG None  Radiology No results found.  Procedures Procedures    Medications Ordered in ED Medications   ondansetron  (ZOFRAN ) injection 4 mg (4 mg Intravenous Given 10/28/23 0905)  lactated ringers  bolus 1,000 mL (0 mLs Intravenous Stopped 10/28/23 1102)  diazepam  (VALIUM ) injection 2.5 mg (2.5 mg Intravenous Given 10/28/23 0933)  metoCLOPramide  (REGLAN ) injection 10 mg (10 mg Intravenous Given 10/28/23 1116)  diazepam  (VALIUM ) injection 2.5 mg (2.5 mg Intravenous Given 10/28/23 1202)    ED Course/ Medical Decision Making/ A&P                          Medical Decision Making Amount and/or Complexity of Data Reviewed Labs: ordered. Decision-making details documented in ED Course.  Risk Prescription drug management.    This patient presents to the ED for concern of nausea/vomiting, this involves an extensive number of treatment options, and is a complaint that carries with it a high risk of complications and morbidity.  I considered the following differential and admission for this acute, potentially life threatening condition.   MDM:    Patient recently took mifepristone for elective abortion of single live IUP.  Patient has not yet experienced any vaginal bleeding but is now experiencing nausea and vomiting as well as generalized pain.  These are likely side effects of the mifepristone.  Also considered viral gastroenteritis, symptoms of early pregnancy.  She does not have any abdominal tenderness palpation or report any abdominal pain, no signs of peritonitis on exam, low concern for acute surgical intra-abdominal emergency.  She denies any abdominal pain or fever/chills, and without any vaginal bleeding, lower concern for acute blood loss anemia.  Lower concern for failed elective abortion given the patient has not yet blood or passed any tissue, but she just took it yesterday.  Reassuringly she has no hypo-/hyperglycemia or electrolyte derangement/renal injury.  She does feel lightheaded and will give a liter of fluid.  Will give Zofran  and Valium  for anxiety/emotional distress as well as IV  fluids.  Clinical Course as of 10/28/23 1454  Tue Oct 28, 2023  0832 Glucose-Capillary: 95 [HN]  0959 CBC, CMP, lipase unremarkable.  Does have very mild hypokalemia and can replete when she is able to take p.o. [HN]  1054 Patient did have improved symptoms with Valium  and Zofran .  Her vomiting has returned.  Will give IV Reglan . [HN]  1141 Pt states reglan  didn't help. Requesting additional valium  IV. Will give another 2.5 mg IV valium . [HN]  1147 Urinalysis, Routine w reflex microscopic -Urine, Clean Catch(!) No UTI, mild ketones [HN]  1450 Patient states she felt better after second dose of Valium  and resting.  Her heart rate was intermittently in the 40s and 50s, likely due to her sleeping and the side effects of the diazepam .  She was given ginger ale and per the nurse she had good intake however when I went to reevaluate the patient she stated that she did throw up the ginger  ale.  I discussed with the patient that it is very important she is able to eat and drink in order to stay well-hydrated at home.  Patient states that she had a Zofran  prescription sent to the pharmacy earlier and is going to pick that up and try p.o. at home.  I offered the patient further IV antiemetic treatment and further IV fluids but patient denies stating she would rather try to take p.o. at home and return if she is unable.  Patient is also supposed to take the second dose of her mifepristone today, I suspect that the nausea vomiting was likely a side effect of the mifepristone.  She had contacted [facility] who told her to take the second dose vaginally instead of orally given her nausea and vomiting.  I do recommend taking it vaginally but I warned the patient that she may still experience worsening nausea and vomiting and to return to the ER if she cannot take p.o.  Patient reports understanding.  Given discharge instructions and return precautions, all questions answered to patient satisfaction. [HN]     Clinical Course User Index [HN] Franklyn Sid SAILOR, MD    Labs: I Ordered, and personally interpreted labs.  The pertinent results include: Those listed above  Additional history obtained from chart review.  External records from outside source obtained and reviewed including [facility]  Cardiac Monitoring: The patient was maintained on a cardiac monitor.  I personally viewed and interpreted the cardiac monitored which showed an underlying rhythm of: Normal sinus rhythm  Reevaluation: After the interventions noted above, I reevaluated the patient and found that they have :improved  Social Determinants of Health:  lives independently  Disposition:  DC w/ discharge instructions/return precautions. All questions answered to patient's satisfaction.    Co morbidities that complicate the patient evaluation  Past Medical History:  Diagnosis Date   Anxiety    Asthma    Cancer (HCC)    Depression    Dysrhythmia    Hypothyroidism    Thyroid  disease    Phreesia 02/29/2020     Medicines Meds ordered this encounter  Medications   ondansetron  (ZOFRAN ) injection 4 mg   lactated ringers  bolus 1,000 mL   diazepam  (VALIUM ) injection 2.5 mg   DISCONTD: ondansetron  (ZOFRAN ) injection 4 mg   metoCLOPramide  (REGLAN ) injection 10 mg   diazepam  (VALIUM ) injection 2.5 mg    I have reviewed the patients home medicines and have made adjustments as needed  Problem List / ED Course: Problem List Items Addressed This Visit   None Visit Diagnoses       Nausea and vomiting, unspecified vomiting type    -  Primary     Medication side effect, initial encounter                       This note was created using dictation software, which may contain spelling or grammatical errors.    Franklyn Sid SAILOR, MD 10/28/23 1455

## 2023-10-28 NOTE — Discharge Instructions (Signed)
 Thank you for coming to [facility] Emergency Department. You were seen for nausea and vomiting likely a side effect of the mifepristone.  We offered you further treatment in the emergency department but you elected to be discharged.  Please take Zofran  4 mg at home as needed every 6-8 hours for nausea and vomiting.  If you cannot eat or drink you need to return to the emergency department. Please follow up with your primary care provider within 1 week.   Do not hesitate to return to the ED or call 911 if you experience: -Worsening symptoms -Nausea vomiting so severe that you cannot eat, drink, or take your medications -Severe abdominal pain -Severe vaginal bleeding saturating greater than 2 pads per hour for greater than 2 hours -Lightheadedness, passing out -Fevers/chills -Anything else that concerns you

## 2023-10-28 NOTE — ED Triage Notes (Signed)
 Pt c/o N/V since this morning, generalized aches and pain as well, currently taking  mifepristone and experiencing bleeding. States she feels like I feel like about to faint

## 2023-10-28 NOTE — ED Notes (Signed)
 Pt tolerated oral fluids with no further n/v, states she feel a lot better. ED provider notified

## 2023-11-25 NOTE — Progress Notes (Signed)
 23 year old gravida 5 para 80 presents for routine annual GYN assessment STD screening thyroid  levels and wants to get a close her endocrine doctor.  She is use Dr. Naida in the past and will attempt to return to his care.  She is presently 131 pounds blood pressure 110/60 her head and neck unremarkable.  No breast exam was performed she underwent Pap smear and bimanual exam both of which were normal.  Her rectum is grossly normal.  She recently underwent a TA at [facility] in [place] and had a Nexplanon inserted which she was told was good for 5 years however she understands as we do that 3 years is the present longevity.  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  She was encouraged to work hard and succeed.

## 2023-11-25 NOTE — Progress Notes (Signed)
 Patient here for annual exam and pap smear. Recent TAB with nexplanon inserted the same time. Requests urine and blood sti screening and also would like her thyroid  levels checked and wants to know if referral can be sent to see about a closer endocrinology provider for her.

## 2023-11-27 ENCOUNTER — Telehealth: Payer: Self-pay | Admitting: "Endocrinology

## 2023-11-27 NOTE — Telephone Encounter (Signed)
 Pt is requesting to see you again. She is currently pregnant. Do you have everything in chart to see her? Looks like she was seeing another endocrinologist

## 2023-12-02 ENCOUNTER — Encounter: Payer: Self-pay | Admitting: "Endocrinology

## 2023-12-02 ENCOUNTER — Ambulatory Visit: Admitting: "Endocrinology

## 2023-12-02 NOTE — Telephone Encounter (Signed)
 Pt no showed.

## 2023-12-02 NOTE — Telephone Encounter (Signed)
 Patient called and I made her aware. She will call her current OB back

## 2023-12-02 NOTE — Telephone Encounter (Signed)
 Please advise on no show

## 2023-12-02 NOTE — Telephone Encounter (Signed)
 Faxed note to OBGYN and mailed pt a letter

## 2024-03-13 ENCOUNTER — Ambulatory Visit
Admission: EM | Admit: 2024-03-13 | Discharge: 2024-03-13 | Disposition: A | Discharge status: Home / Self Care | Attending: Nurse Practitioner | Admitting: Nurse Practitioner

## 2024-03-13 ENCOUNTER — Other Ambulatory Visit: Payer: Self-pay

## 2024-03-13 DIAGNOSIS — J029 Acute pharyngitis, unspecified: Secondary | ICD-10-CM | POA: Insufficient documentation

## 2024-03-13 DIAGNOSIS — B349 Viral infection, unspecified: Secondary | ICD-10-CM | POA: Insufficient documentation

## 2024-03-13 LAB — POCT MONO SCREEN (KUC): Mono, POC: NEGATIVE

## 2024-03-13 LAB — POCT RAPID STREP A (OFFICE): Rapid Strep A Screen: NEGATIVE

## 2024-03-13 LAB — POCT INFLUENZA A/B
Influenza A, POC: NEGATIVE
Influenza B, POC: NEGATIVE

## 2024-03-13 LAB — POC SOFIA SARS ANTIGEN FIA: SARS Coronavirus 2 Ag: NEGATIVE

## 2024-03-13 MED ORDER — ONDANSETRON 4 MG PO TBDP
4.0000 mg | ORAL_TABLET | Freq: Once | ORAL | Status: AC
  Administered 2024-03-13: 4 mg via ORAL

## 2024-03-13 MED ORDER — ONDANSETRON 4 MG PO TBDP: 4.0000 mg | ORAL_TABLET | Freq: Three times a day (TID) | ORAL | 0 refills | Status: DC | PRN

## 2024-03-13 MED ORDER — KETOROLAC TROMETHAMINE 30 MG/ML IJ SOLN
30.0000 mg | Freq: Once | INTRAMUSCULAR | Status: AC
  Administered 2024-03-13: 30 mg via INTRAMUSCULAR

## 2024-03-13 MED ORDER — LIDOCAINE VISCOUS HCL 2 % MT SOLN: OROMUCOSAL | 0 refills | Status: AC

## 2024-03-13 NOTE — Discharge Instructions (Addendum)
 The rapid strep test, monotest, influenza test, and COVID test were negative.  A throat culture has been ordered.  You will be contacted if the test results are abnormal.  You will also have access to the results via MyChart. You were given an injection of Toradol  30 mg and Zofran  4 mg today.  Do not take any additional NSAIDs to include ibuprofen , Aleve, Motrin , Advil , or naproxen.  You may take Tylenol  for breakthrough pain or discomfort. Warm salt water gargles 3-4 times daily as needed for throat pain or discomfort.  You may also use over-the-counter Chloraseptic throat spray or throat lozenges while symptoms persist. Recommend a BRAT diet until nausea and vomiting improved.  This includes bananas, rice, applesauce, and toast. Increase fluids and allow for plenty of rest.  Try to drink at least 8-10 8 ounce glasses of water daily while symptoms persist. Recommend following up with your primary care physician for recheck of your thyroid  is soon as possible. Go to the emergency department if you experience worsening nausea, vomiting, abdominal pain, or other concerns. Follow-up as needed.

## 2024-03-13 NOTE — ED Provider Notes (Signed)
 " [facility]    CSN: [id number] Arrival date & time: 03/13/24  1204      History   Chief Complaint Chief Complaint  Patient presents with   Sore Throat   Shortness of Breath    HPI Monique Long is a 24 y.o. female.   The history is provided by the patient.   Patient presents for complaints of sore throat, fever, nausea, vomiting, and generalized weakness.  Patient states she has had nausea and vomiting along with shortness of breath and weakness for the past 4 days with sore throat starting over the past 24 hours.  States fever was 102 last evening.  States that she has vomited on 2 separate occasions today.  Denies headache, ear pain, wheezing, difficulty breathing, abdominal pain, diarrhea, or rash.  States that she has been taking Tylenol  for her symptoms.  History of thyroidectomy in 2022.  Past Medical History:  Diagnosis Date   Anxiety    Asthma    Cancer (HCC)    Depression    Dysrhythmia    Hypothyroidism    Thyroid  disease    [employer] 02/29/2020    Patient Active Problem List   Diagnosis Date Noted   Depression, major, single episode, moderate (HCC) 07/03/2021   Multiple thyroid  nodules 07/11/2020   Hypotension 07/11/2020   Menstruation, irregular 02/23/2020   Hypothyroidism due to Hashimoto's thyroiditis 01/10/2020    Past Surgical History:  Procedure Laterality Date   NO PAST SURGERIES     THYROIDECTOMY Bilateral 10/09/2020   Procedure: TOTAL THYROIDECTOMY;  Surgeon: Eletha Boas, MD;  Location: [facility];  Service: General;  Laterality: Bilateral;    OB History     Gravida  1   Para      Term      Preterm      AB      Living         SAB      IAB      Ectopic      Multiple      Live Births               Home Medications    Prior to Admission medications  Medication Sig Start Date End Date Taking? Authorizing Provider  lidocaine  (XYLOCAINE ) 2 % solution Gargle and spit 5 mL every 6 hours as needed for throat pain  or discomfort. 03/13/24  Yes Leath-Warren, Etta PARAS, NP  ondansetron  (ZOFRAN -ODT) 4 MG disintegrating tablet Take 1 tablet (4 mg total) by mouth every 8 (eight) hours as needed. 03/13/24  Yes Leath-Warren, Etta PARAS, NP  fluticasone  (FLONASE ) 50 MCG/ACT nasal spray Place 2 sprays into both nostrils daily. Patient not taking: Reported on 03/13/2024 07/08/21   Leath-Warren, Etta PARAS, NP  fluticasone  (FLONASE ) 50 MCG/ACT nasal spray Place 2 sprays into both nostrils daily. Patient not taking: Reported on 03/13/2024 07/08/21   Leath-Warren, Etta PARAS, NP  HYDROcodone -acetaminophen  (NORCO/VICODIN) 5-325 MG tablet Take 1 tablet by mouth every 6 (six) hours as needed for severe pain (pain score 7-10). Patient not taking: Reported on 03/13/2024 05/08/23   Midge Golas, MD  levothyroxine  (SYNTHROID ) 137 MCG tablet Take 1 tablet (137 mcg total) by mouth daily before breakfast. 08/06/22   Nida, Gebreselassie W, MD  Prenatal Vit-Fe Fumarate-FA (PRENATAL VITAMIN PO) Take 1 tablet by mouth every other day. Patient not taking: Reported on 03/13/2024    [provider]    Family History Family History  Problem Relation Age of Onset  Hypertension Mother    Heart failure Mother    Cancer Father     Social History Social History[1]   Allergies   Amoxicillin and Penicillins   Review of Systems Review of Systems Per HPI  Physical Exam Triage Vital Signs ED Triage Vitals  Encounter Vitals Group     BP 03/13/24 1247 117/78     Girls Systolic BP Percentile --      Girls Diastolic BP Percentile --      Boys Systolic BP Percentile --      Boys Diastolic BP Percentile --      Pulse Rate 03/13/24 1247 (!) 104     Resp 03/13/24 1247 19     Temp 03/13/24 1247 99.5 F (37.5 C)     Temp Source 03/13/24 1247 Oral     SpO2 03/13/24 1247 97 %     Weight --      Height --      Head Circumference --      Peak Flow --      Pain Score 03/13/24 1245 10     Pain Loc --      Pain Education --       Exclude from Growth Chart --    No data found.  Updated Vital Signs BP 117/78 (BP Location: Right Arm)   Pulse (!) 104   Temp 99.5 F (37.5 C) (Oral)   Resp 19   LMP 03/06/2024 (Approximate)   SpO2 97%   Breastfeeding No   Visual Acuity Right Eye Distance:   Left Eye Distance:   Bilateral Distance:    Right Eye Near:   Left Eye Near:    Bilateral Near:     Physical Exam Vitals and nursing note reviewed.  Constitutional:      General: She is not in acute distress.    Appearance: Normal appearance.  HENT:     Head: Normocephalic.     Right Ear: Tympanic membrane, ear canal and external ear normal.     Left Ear: Tympanic membrane, ear canal and external ear normal.     Nose: Nose normal.     Right Turbinates: Enlarged and swollen.     Left Turbinates: Enlarged and swollen.     Right Sinus: No maxillary sinus tenderness or frontal sinus tenderness.     Left Sinus: No maxillary sinus tenderness or frontal sinus tenderness.     Mouth/Throat:     Lips: Pink.     Mouth: Mucous membranes are moist.     Pharynx: Pharyngeal swelling, posterior oropharyngeal erythema and uvula swelling present. No oropharyngeal exudate.     Tonsils: 1+ on the right. 1+ on the left.  Eyes:     Extraocular Movements: Extraocular movements intact.     Conjunctiva/sclera: Conjunctivae normal.     Pupils: Pupils are equal, round, and reactive to light.  Cardiovascular:     Rate and Rhythm: Regular rhythm. Tachycardia present.     Pulses: Normal pulses.     Heart sounds: Normal heart sounds.  Pulmonary:     Effort: Pulmonary effort is normal. No respiratory distress.     Breath sounds: Normal breath sounds. No stridor. No wheezing, rhonchi or rales.  Abdominal:     General: Bowel sounds are normal.     Palpations: Abdomen is soft.     Tenderness: There is no abdominal tenderness.  Musculoskeletal:     Cervical back: Normal range of motion.  Skin:    General: Skin is warm  and dry.   Neurological:     General: No focal deficit present.     Mental Status: She is alert and oriented to person, place, and time.  Psychiatric:        Mood and Affect: Mood normal.        Behavior: Behavior normal.      UC Treatments / Results  Labs (all labs ordered are listed, but only abnormal results are displayed) Labs Reviewed  POCT INFLUENZA A/B - Normal  POCT RAPID STREP A (OFFICE) - Normal  POCT MONO SCREEN ([facility]) - Normal  CULTURE, GROUP A STREP ([facility])  POC SOFIA SARS ANTIGEN FIA    EKG   Radiology No results found.  Procedures Procedures (including critical care time)  Medications Ordered in UC Medications  ondansetron  (ZOFRAN -ODT) disintegrating tablet 4 mg (4 mg Oral Given 03/13/24 1328)  ketorolac  (TORADOL ) 30 MG/ML injection 30 mg (30 mg Intramuscular Given 03/13/24 1345)    Initial Impression / Assessment and Plan / UC Course  I have reviewed the triage vital signs and the nursing notes.  Pertinent labs & imaging results that were available during my care of the patient were reviewed by me and considered in my medical decision making (see chart for details).  On exam, lung sounds are clear throughout, room air sats at 97%.  The patient's vital signs are stable, she is in no acute distress.  The rapid strep test, COVID test, monotest and influenza test were negative.  Throat culture is pending.  Patient continues to complain of throat pain.  Toradol  30 mg IM administered.  Zofran  4 mg ODT administered for nausea and vomiting.  Symptoms consistent with viral etiology pending the throat culture result.  Will treat symptomatically with Zofran  4 mg ODT and viscous lidocaine  2% for patient to gargle and spit for throat pain or discomfort while throat culture is pending.  Supportive care recommendations were provided and discussed with the patient to include over-the-counter analgesics, fluids, rest, warm salt water gargles, and a BRAT diet.  Discussed indications with the  patient regarding follow-up.  Patient was advised to follow-up with her PCP for recheck of her thyroid .   Final Clinical Impressions(s) / UC Diagnoses   Final diagnoses:  Sore throat  Viral illness     Discharge Instructions      The rapid strep test, monotest, influenza test, and COVID test were negative.  A throat culture has been ordered.  You will be contacted if the test results are abnormal.  You will also have access to the results via MyChart. You were given an injection of Toradol  30 mg and Zofran  4 mg today.  Do not take any additional NSAIDs to include ibuprofen , Aleve, Motrin , Advil , or naproxen.  You may take Tylenol  for breakthrough pain or discomfort. Warm salt water gargles 3-4 times daily as needed for throat pain or discomfort.  You may also use over-the-counter Chloraseptic throat spray or throat lozenges while symptoms persist. Recommend a BRAT diet until nausea and vomiting improved.  This includes bananas, rice, applesauce, and toast. Increase fluids and allow for plenty of rest.  Try to drink at least 8-10 8 ounce glasses of water daily while symptoms persist. Recommend following up with your primary care physician for recheck of your thyroid  is soon as possible. Go to the emergency department if you experience worsening nausea, vomiting, abdominal pain, or other concerns. Follow-up as needed.     ED Prescriptions     Medication Sig Dispense Auth.  Provider   lidocaine  (XYLOCAINE ) 2 % solution Gargle and spit 5 mL every 6 hours as needed for throat pain or discomfort. 100 mL Leath-Warren, Etta PARAS, NP   ondansetron  (ZOFRAN -ODT) 4 MG disintegrating tablet Take 1 tablet (4 mg total) by mouth every 8 (eight) hours as needed. 20 tablet Leath-Warren, Etta PARAS, NP      PDMP not reviewed this encounter.    [1]  Social History Tobacco Use   Smoking status: Every Day    Types: E-cigarettes   Smokeless tobacco: Never  Vaping Use   Vaping status: Every Day    Substances: Nicotine  Substance Use Topics   Alcohol use: No   Drug use: Not Currently    Types: Marijuana     Gilmer Etta PARAS, NP 03/13/24 1405  "

## 2024-03-13 NOTE — ED Triage Notes (Signed)
 Pt being seen in UC for sore throat x 1 day, n/v, shortness of breath, and weakness x4 days. Pt reports having headache and fever. Pt reports taking tylenol . Pt reports having thyroid  removed in 2022.

## 2024-03-14 ENCOUNTER — Emergency Department (HOSPITAL_COMMUNITY)
Admission: EM | Admit: 2024-03-14 | Discharge: 2024-03-14 | Disposition: A | Discharge status: Home / Self Care | Attending: Emergency Medicine | Admitting: Emergency Medicine

## 2024-03-14 ENCOUNTER — Other Ambulatory Visit: Payer: Self-pay

## 2024-03-14 ENCOUNTER — Encounter (HOSPITAL_COMMUNITY): Payer: Self-pay

## 2024-03-14 DIAGNOSIS — J029 Acute pharyngitis, unspecified: Secondary | ICD-10-CM | POA: Diagnosis present

## 2024-03-14 DIAGNOSIS — J0300 Acute streptococcal tonsillitis, unspecified: Secondary | ICD-10-CM | POA: Insufficient documentation

## 2024-03-14 LAB — URINALYSIS, ROUTINE W REFLEX MICROSCOPIC
Bilirubin Urine: NEGATIVE
Glucose, UA: NEGATIVE mg/dL
Ketones, ur: NEGATIVE mg/dL
Nitrite: NEGATIVE
Protein, ur: 100 mg/dL — AB
Specific Gravity, Urine: 1.029 (ref 1.005–1.030)
pH: 5 (ref 5.0–8.0)

## 2024-03-14 LAB — COMPREHENSIVE METABOLIC PANEL WITH GFR
ALT: 13 U/L (ref 0–44)
AST: 20 U/L (ref 15–41)
Albumin: 4.6 g/dL (ref 3.5–5.0)
Alkaline Phosphatase: 83 U/L (ref 38–126)
Anion gap: 11 (ref 5–15)
BUN: 13 mg/dL (ref 6–20)
CO2: 24 mmol/L (ref 22–32)
Calcium: 9.1 mg/dL (ref 8.9–10.3)
Chloride: 99 mmol/L (ref 98–111)
Creatinine, Ser: 0.92 mg/dL (ref 0.44–1.00)
GFR, Estimated: >60 mL/min
Glucose, Bld: 98 mg/dL (ref 70–99)
Potassium: 4.1 mmol/L (ref 3.5–5.1)
Sodium: 134 mmol/L — ABNORMAL LOW (ref 135–145)
Total Bilirubin: 0.4 mg/dL (ref 0.0–1.2)
Total Protein: 7.8 g/dL (ref 6.5–8.1)

## 2024-03-14 LAB — CBC WITH DIFFERENTIAL/PLATELET
Abs Immature Granulocytes: 0.05 K/uL (ref 0.00–0.07)
Basophils Absolute: 0 K/uL (ref 0.0–0.1)
Basophils Relative: 0 %
Eosinophils Absolute: 0.1 K/uL (ref 0.0–0.5)
Eosinophils Relative: 1 %
HCT: 41 % (ref 36.0–46.0)
Hemoglobin: 13.3 g/dL (ref 12.0–15.0)
Immature Granulocytes: 1 %
Lymphocytes Relative: 7 %
Lymphs Abs: 0.8 K/uL (ref 0.7–4.0)
MCH: 28.1 pg (ref 26.0–34.0)
MCHC: 32.4 g/dL (ref 30.0–36.0)
MCV: 86.5 fL (ref 80.0–100.0)
Monocytes Absolute: 0.9 K/uL (ref 0.1–1.0)
Monocytes Relative: 8 %
Neutro Abs: 9.1 K/uL — ABNORMAL HIGH (ref 1.7–7.7)
Neutrophils Relative %: 83 %
Platelets: 212 K/uL (ref 150–400)
RBC: 4.74 MIL/uL (ref 3.87–5.11)
RDW: 13.1 % (ref 11.5–15.5)
WBC: 10.8 K/uL — ABNORMAL HIGH (ref 4.0–10.5)
nRBC: 0 % (ref 0.0–0.2)

## 2024-03-14 LAB — MONONUCLEOSIS SCREEN: Mono Screen: NEGATIVE

## 2024-03-14 LAB — RESP PANEL BY RT-PCR (RSV, FLU A&B, COVID)  RVPGX2
Influenza A by PCR: NEGATIVE
Influenza B by PCR: NEGATIVE
Resp Syncytial Virus by PCR: NEGATIVE
SARS Coronavirus 2 by RT PCR: NEGATIVE

## 2024-03-14 LAB — GROUP A STREP BY PCR: Group A Strep by PCR: DETECTED — AB

## 2024-03-14 LAB — TSH: TSH: 0.266 u[IU]/mL — ABNORMAL LOW (ref 0.350–4.500)

## 2024-03-14 MED ORDER — HYDROCODONE-ACETAMINOPHEN 5-325 MG PO TABS: 1.0000 | ORAL_TABLET | Freq: Four times a day (QID) | ORAL | 0 refills | Status: AC | PRN

## 2024-03-14 MED ORDER — CEFDINIR 300 MG PO CAPS: 300.0000 mg | ORAL_CAPSULE | Freq: Two times a day (BID) | ORAL | 0 refills | Status: AC

## 2024-03-14 NOTE — ED Triage Notes (Signed)
 Pt arrives via POV. PT reports for the past few days, she has been experiencing chest pain, fevers, nausea, vomiting, and sore throat. She reports she has tested negative for covid, flu, and strep. She is concerned she may be experiencing and Thyroid  storm.

## 2024-03-14 NOTE — ED Provider Triage Note (Signed)
 Emergency Medicine Provider Triage Evaluation Note  Monique Long , a 24 y.o. female  was evaluated in triage.  Pt complains of a sore throat for the past 3 days.  Reports swollen lymph nodes in her throat.  Denies any inability to swallow.  Also reports feeling somewhat dizzy and reports fever and chills at home.  Took Tylenol  and ibuprofen  prior to arrival.  Review of Systems  Positive: As above Negative: As above  Physical Exam  BP 104/61 (BP Location: Right Arm)   Pulse (!) 105   Temp 100.2 F (37.9 C) (Oral)   Resp 18   LMP 03/06/2024 (Approximate)   SpO2 99%  Gen:   Awake, no distress   Resp:  Normal effort  MSK:   Moves extremities without difficulty  Other:  Tonsillar exudate bilaterally and erythematous posterior oropharynx.  Patient swallowing without difficulty.  No visualized peritonsillar abscess.  No respiratory distress.  Medical Decision Making  Medically screening exam initiated at 2:31 PM.  Appropriate orders placed.  Monique Long was informed that the remainder of the evaluation will be completed by another provider, this initial triage assessment does not replace that evaluation, and the importance of remaining in the ED until their evaluation is complete.     Veta Palma, PA-C 03/14/24 1431

## 2024-03-14 NOTE — ED Provider Notes (Signed)
 " [place] [facility] Provider Note   CSN: [id number] Arrival date & time: 03/14/24  1403     Patient presents with: Chest Pain, Sore Throat, and Vomiting   Monique Long is a 24 y.o. female.   Pt complains of a sore throat.  Pt reports it is very painful to swallow.  Pt has had a fever.  Pt reports she evaluation yesterday and testing was negative.  Pt is concerned about her thyroid .  Pt has had a total thyroidectomy.  Pt is on synthroid .  Pt denies any exposure to illness. Pt complains of body aches.  No relief with tylenol  or ibuprofen .    The history is provided by the patient and a relative. No language interpreter was used.  Chest Pain Pain radiates to:  Does not radiate Pain severity:  Moderate Onset quality:  Gradual Progression:  Worsening Chronicity:  New Sore Throat Associated symptoms include chest pain.       Prior to Admission medications  Medication Sig Start Date End Date Taking? Authorizing Provider  cefdinir  (OMNICEF ) 300 MG capsule Take 1 capsule (300 mg total) by mouth 2 (two) times daily. 03/14/24  Yes Dwayne Begay K, PA-C  fluticasone  (FLONASE ) 50 MCG/ACT nasal spray Place 2 sprays into both nostrils daily. Patient not taking: Reported on 03/13/2024 07/08/21   Leath-Warren, Etta PARAS, NP  fluticasone  (FLONASE ) 50 MCG/ACT nasal spray Place 2 sprays into both nostrils daily. Patient not taking: Reported on 03/13/2024 07/08/21   Leath-Warren, Etta PARAS, NP  HYDROcodone -acetaminophen  (NORCO/VICODIN) 5-325 MG tablet Take 1 tablet by mouth every 6 (six) hours as needed for severe pain (pain score 7-10). 03/14/24   Jhaden Pizzuto K, PA-C  levothyroxine  (SYNTHROID ) 137 MCG tablet Take 1 tablet (137 mcg total) by mouth daily before breakfast. 08/06/22   Nida, Ethelle ORN, MD  lidocaine  (XYLOCAINE ) 2 % solution Gargle and spit 5 mL every 6 hours as needed for throat pain or discomfort. 03/13/24   Leath-Warren, Etta PARAS, NP  ondansetron   (ZOFRAN -ODT) 4 MG disintegrating tablet Take 1 tablet (4 mg total) by mouth every 8 (eight) hours as needed. 03/13/24   Leath-Warren, Etta PARAS, NP  Prenatal Vit-Fe Fumarate-FA (PRENATAL VITAMIN PO) Take 1 tablet by mouth every other day. Patient not taking: Reported on 03/13/2024    [provider]    Allergies: Amoxicillin and Penicillins    Review of Systems  Cardiovascular:  Positive for chest pain.  All other systems reviewed and are negative.   Updated Vital Signs BP 104/61 (BP Location: Right Arm)   Pulse (!) 105   Temp 100.2 F (37.9 C) (Oral)   Resp 18   LMP 03/06/2024 (Approximate)   SpO2 99%   Physical Exam Vitals and nursing note reviewed.  Constitutional:      Appearance: She is well-developed.  HENT:     Head: Normocephalic.  Neck:     Comments: Erythema throat, injected, swollen, no sign of abscess Cardiovascular:     Rate and Rhythm: Normal rate.     Heart sounds: Normal heart sounds.  Pulmonary:     Effort: Pulmonary effort is normal.     Breath sounds: Normal breath sounds.  Abdominal:     General: There is no distension.  Musculoskeletal:        General: Normal range of motion.     Cervical back: Normal range of motion.  Skin:    General: Skin is warm.  Neurological:     General:  No focal deficit present.     Mental Status: She is alert and oriented to person, place, and time.  Psychiatric:        Mood and Affect: Mood normal.     (all labs ordered are listed, but only abnormal results are displayed) Labs Reviewed  GROUP A STREP BY PCR - Abnormal; Notable for the following components:      Result Value   Group A Strep by PCR DETECTED (*)    All other components within normal limits  CBC WITH DIFFERENTIAL/PLATELET - Abnormal; Notable for the following components:   WBC 10.8 (*)    Neutro Abs 9.1 (*)    All other components within normal limits  COMPREHENSIVE METABOLIC PANEL WITH GFR - Abnormal; Notable for the following components:    Sodium 134 (*)    All other components within normal limits  URINALYSIS, ROUTINE W REFLEX MICROSCOPIC - Abnormal; Notable for the following components:   Color, Urine AMBER (*)    APPearance CLOUDY (*)    Hgb urine dipstick SMALL (*)    Protein, ur 100 (*)    Leukocytes,Ua MODERATE (*)    Bacteria, UA FEW (*)    All other components within normal limits  TSH - Abnormal; Notable for the following components:   TSH 0.266 (*)    All other components within normal limits  RESP PANEL BY RT-PCR (RSV, FLU A&B, COVID)  RVPGX2  MONONUCLEOSIS SCREEN  HCG, SERUM, QUALITATIVE  PREGNANCY, URINE    EKG: None  Radiology: No results found.   Procedures   Medications Ordered in the ED - No data to display                                  Medical Decision Making Pt complains of body aches and sore throat.  Pt also worried about her thyroid .  Pt has had a complete thyroidectomy  Amount and/or Complexity of Data Reviewed Independent Historian:     Details: Pt here with Family who is supportive  Labs: ordered. Decision-making details documented in ED Course.    Details: Labs ordered reviewed and interpreted.  Strep screen is positive.  Ua shows 21-50 wbc's.    Risk Prescription drug management. Risk Details: Pt given rx for cefdinir  and hydrocodone .  Pt advised to follow up with primary for any thyroid  concerns         Final diagnoses:  Strep tonsillitis    ED Discharge Orders          Ordered    HYDROcodone -acetaminophen  (NORCO/VICODIN) 5-325 MG tablet  Every 6 hours PRN        03/14/24 1632    cefdinir  (OMNICEF ) 300 MG capsule  2 times daily        03/14/24 1632            An After Visit Summary was printed and given to the patient.    Flint Sonny POUR, PA-C 03/14/24 1645    Charlyn Sora, MD 03/18/24 1418  "

## 2024-03-14 NOTE — Discharge Instructions (Addendum)
 Follow up with your Physician for recheck.  Return if any problems.

## 2024-03-15 ENCOUNTER — Ambulatory Visit (HOSPITAL_COMMUNITY): Payer: Self-pay

## 2024-03-15 ENCOUNTER — Telehealth: Payer: Self-pay

## 2024-03-15 LAB — CULTURE, GROUP A STREP (THRC)

## 2024-03-15 NOTE — Telephone Encounter (Signed)
 Copied from CRM [id number]. Topic: General - Other >> Mar 15, 2024  1:51 PM Lonell PEDLAR wrote: Reason for CRM: Patient states that she spoke with Dr. Tobie about a month ago via MyChart and would like to establish care with him. I did advise that Dr. Tobie was not taking any new patients at the moment, and she requested I send a message to see if he would take her in as a patient given previous discussion.  C/b: [phone]

## 2024-03-15 NOTE — Telephone Encounter (Unsigned)
 Copied from CRM [id number]. Topic: General - Other >> Mar 15, 2024  2:53 PM Thersia C wrote: Patient called in wanting to follow up with this message from earlier would like a callback

## 2024-03-15 NOTE — Telephone Encounter (Unsigned)
 Copied from CRM [id number]. Topic: General - Other >> Mar 15, 2024  1:51 PM Lonell PEDLAR wrote: Reason for CRM: Patient states that she spoke with Dr. Tobie about a month ago via MyChart and would like to establish care with him. I did advise that Dr. Tobie was not taking any new patients at the moment, and she requested I send a message to see if he would take her in as a patient given previous discussion.  C/b: [phone]

## 2024-03-25 ENCOUNTER — Emergency Department (HOSPITAL_COMMUNITY)
Admission: EM | Admit: 2024-03-25 | Discharge: 2024-03-25 | Disposition: A | Discharge status: Home / Self Care | Attending: Emergency Medicine | Admitting: Emergency Medicine

## 2024-03-25 ENCOUNTER — Emergency Department (HOSPITAL_COMMUNITY)

## 2024-03-25 ENCOUNTER — Other Ambulatory Visit: Payer: Self-pay

## 2024-03-25 ENCOUNTER — Encounter (HOSPITAL_COMMUNITY): Payer: Self-pay | Admitting: *Deleted

## 2024-03-25 DIAGNOSIS — Z85850 Personal history of malignant neoplasm of thyroid: Secondary | ICD-10-CM | POA: Diagnosis not present

## 2024-03-25 DIAGNOSIS — R1110 Vomiting, unspecified: Secondary | ICD-10-CM | POA: Diagnosis present

## 2024-03-25 DIAGNOSIS — R059 Cough, unspecified: Secondary | ICD-10-CM | POA: Diagnosis not present

## 2024-03-25 DIAGNOSIS — R509 Fever, unspecified: Secondary | ICD-10-CM | POA: Insufficient documentation

## 2024-03-25 LAB — COMPREHENSIVE METABOLIC PANEL WITH GFR
ALT: 16 U/L (ref 0–44)
AST: 19 U/L (ref 15–41)
Albumin: 4.9 g/dL (ref 3.5–5.0)
Alkaline Phosphatase: 66 U/L (ref 38–126)
Anion gap: 13 (ref 5–15)
BUN: 14 mg/dL (ref 6–20)
CO2: 24 mmol/L (ref 22–32)
Calcium: 9.3 mg/dL (ref 8.9–10.3)
Chloride: 102 mmol/L (ref 98–111)
Creatinine, Ser: 0.77 mg/dL (ref 0.44–1.00)
GFR, Estimated: >60 mL/min
Glucose, Bld: 89 mg/dL (ref 70–99)
Potassium: 3.7 mmol/L (ref 3.5–5.1)
Sodium: 139 mmol/L (ref 135–145)
Total Bilirubin: 0.3 mg/dL (ref 0.0–1.2)
Total Protein: 8 g/dL (ref 6.5–8.1)

## 2024-03-25 LAB — CBC WITH DIFFERENTIAL/PLATELET
Abs Immature Granulocytes: 0.08 K/uL — ABNORMAL HIGH (ref 0.00–0.07)
Basophils Absolute: 0 K/uL (ref 0.0–0.1)
Basophils Relative: 0 %
Eosinophils Absolute: 0.3 K/uL (ref 0.0–0.5)
Eosinophils Relative: 2 %
HCT: 42.8 % (ref 36.0–46.0)
Hemoglobin: 13.7 g/dL (ref 12.0–15.0)
Immature Granulocytes: 1 %
Lymphocytes Relative: 7 %
Lymphs Abs: 1.1 K/uL (ref 0.7–4.0)
MCH: 27.7 pg (ref 26.0–34.0)
MCHC: 32 g/dL (ref 30.0–36.0)
MCV: 86.5 fL (ref 80.0–100.0)
Monocytes Absolute: 0.5 K/uL (ref 0.1–1.0)
Monocytes Relative: 3 %
Neutro Abs: 13.6 K/uL — ABNORMAL HIGH (ref 1.7–7.7)
Neutrophils Relative %: 87 %
Platelets: 408 K/uL — ABNORMAL HIGH (ref 150–400)
RBC: 4.95 MIL/uL (ref 3.87–5.11)
RDW: 13.2 % (ref 11.5–15.5)
WBC: 15.5 K/uL — ABNORMAL HIGH (ref 4.0–10.5)
nRBC: 0 % (ref 0.0–0.2)

## 2024-03-25 LAB — HCG, SERUM, QUALITATIVE: Preg, Serum: NEGATIVE

## 2024-03-25 LAB — RESP PANEL BY RT-PCR (RSV, FLU A&B, COVID)  RVPGX2
Influenza A by PCR: NEGATIVE
Influenza B by PCR: NEGATIVE
Resp Syncytial Virus by PCR: NEGATIVE
SARS Coronavirus 2 by RT PCR: NEGATIVE

## 2024-03-25 LAB — LIPASE, BLOOD: Lipase: 32 U/L (ref 11–51)

## 2024-03-25 LAB — TROPONIN T, HIGH SENSITIVITY
Troponin T High Sensitivity: <15 ng/L (ref 0–19)
Troponin T High Sensitivity: <15 ng/L (ref 0–19)

## 2024-03-25 LAB — T4, FREE: Free T4: 1.36 ng/dL (ref 0.80–2.00)

## 2024-03-25 LAB — TSH: TSH: 1.35 u[IU]/mL (ref 0.350–4.500)

## 2024-03-25 MED ORDER — SODIUM CHLORIDE 0.9 % IV BOLUS
1000.0000 mL | Freq: Once | INTRAVENOUS | Status: AC
  Administered 2024-03-25: 1000 mL via INTRAVENOUS

## 2024-03-25 MED ORDER — ONDANSETRON 4 MG PO TBDP: 4.0000 mg | ORAL_TABLET | Freq: Three times a day (TID) | ORAL | 0 refills | Status: AC | PRN

## 2024-03-25 MED ORDER — METOCLOPRAMIDE HCL 5 MG/ML IJ SOLN
10.0000 mg | Freq: Once | INTRAMUSCULAR | Status: AC
  Administered 2024-03-25: 10 mg via INTRAVENOUS
  Filled 2024-03-25: qty 2

## 2024-03-25 NOTE — ED Triage Notes (Signed)
 Pt states she has been vomiting since last night; pt states she has thyroid  issues; pt states she is taking levothyroxine  200mcg daily and has been taking it like normal besides today  Pt states she had a temp of 101.3    Pt took zofran  today with no relief

## 2024-03-25 NOTE — Discharge Instructions (Addendum)
 As we discussed, there are still some test that are pending and so your workup is technically incomplete at this point.  Be sure to drink plenty of fluids.  Follow-up closely with your primary care provider.  If you develop any new or worsening symptoms then return to the ER.

## 2024-03-25 NOTE — ED Provider Notes (Signed)
 " [place] EMERGENCY DEPARTMENT AT [facility] Provider Note   CSN: [id number] Arrival date & time: 03/25/24  9097     Patient presents with: Emesis   Monique Long is a 24 y.o. female.   HPI 24 year old female presents with a chief complaint of vomiting.  Vomiting started in the middle of the night.  She has a history of previous thyroidectomy for cancer and is on Synthroid .  She is concerned that her symptoms today are a result of her thyroid  problems.  She states she has had on and off similar episodes to this and has frequently had to come to the ER.  She follows with endocrine at [facility] but they are hard to get in touch with so she states her OB/GYN manages her thyroid .  She recently had strep throat but was having similar sore throat and vomiting as she is today.  She reports a fever of 101 this morning.  She has been having cough since onset around midnight last night.  She had some lower abdominal pain that went away.  Denies any urinary symptoms or missed menstrual cycles.  Besides this morning has been taking her Synthroid  as prescribed.  She took some Zofran  but was still having some symptoms.  Her chest was hurting as recently as about an hour ago.  Prior to Admission medications  Medication Sig Start Date End Date Taking? Authorizing Provider  ondansetron  (ZOFRAN -ODT) 4 MG disintegrating tablet Take 1 tablet (4 mg total) by mouth every 8 (eight) hours as needed for nausea or vomiting. 03/25/24  Yes Freddi Hamilton, MD  cefdinir  (OMNICEF ) 300 MG capsule Take 1 capsule (300 mg total) by mouth 2 (two) times daily. 03/14/24   Sofia, Leslie K, PA-C  fluticasone  (FLONASE ) 50 MCG/ACT nasal spray Place 2 sprays into both nostrils daily. Patient not taking: Reported on 03/13/2024 07/08/21   Leath-Warren, Etta PARAS, NP  fluticasone  (FLONASE ) 50 MCG/ACT nasal spray Place 2 sprays into both nostrils daily. Patient not taking: Reported on 03/13/2024 07/08/21   Leath-Warren, Etta PARAS, NP   HYDROcodone -acetaminophen  (NORCO/VICODIN) 5-325 MG tablet Take 1 tablet by mouth every 6 (six) hours as needed for severe pain (pain score 7-10). 03/14/24   Sofia, Leslie K, PA-C  levothyroxine  (SYNTHROID ) 137 MCG tablet Take 1 tablet (137 mcg total) by mouth daily before breakfast. 08/06/22   Nida, Gebreselassie W, MD  lidocaine  (XYLOCAINE ) 2 % solution Gargle and spit 5 mL every 6 hours as needed for throat pain or discomfort. 03/13/24   Leath-Warren, Etta PARAS, NP  Prenatal Vit-Fe Fumarate-FA (PRENATAL VITAMIN PO) Take 1 tablet by mouth every other day. Patient not taking: Reported on 03/13/2024    [provider]    Allergies: Amoxicillin and Penicillins    Review of Systems  Constitutional:  Positive for fever.  HENT:  Positive for sore throat.   Respiratory:  Positive for cough.   Cardiovascular:  Positive for chest pain.  Gastrointestinal:  Positive for abdominal pain and vomiting. Negative for diarrhea.  Genitourinary:  Negative for dysuria.    Updated Vital Signs BP 108/72   Pulse 78   Temp 98.1 F (36.7 C) (Oral)   Resp 17   Ht 5' 6 (1.676 m)   Wt 61.2 kg   LMP 03/18/2024 (Approximate)   SpO2 98%   BMI 21.79 kg/m   Physical Exam Vitals and nursing note reviewed.  Constitutional:      General: She is not in acute distress.    Appearance: She  is well-developed. She is not ill-appearing or diaphoretic.  HENT:     Head: Normocephalic and atraumatic.     Mouth/Throat:     Pharynx: Oropharynx is clear. No oropharyngeal exudate or posterior oropharyngeal erythema.  Neck:     Comments: No neck swelling or tenderness Cardiovascular:     Rate and Rhythm: Normal rate and regular rhythm.     Heart sounds: Normal heart sounds.  Pulmonary:     Effort: Pulmonary effort is normal.     Breath sounds: Normal breath sounds.  Abdominal:     Palpations: Abdomen is soft.     Tenderness: There is no abdominal tenderness.  Musculoskeletal:     Cervical back: Neck supple.  No rigidity.  Skin:    General: Skin is warm and dry.  Neurological:     Mental Status: She is alert.     (all labs ordered are listed, but only abnormal results are displayed) Labs Reviewed  CBC WITH DIFFERENTIAL/PLATELET - Abnormal; Notable for the following components:      Result Value   WBC 15.5 (*)    Platelets 408 (*)    Neutro Abs 13.6 (*)    Abs Immature Granulocytes 0.08 (*)    All other components within normal limits  RESP PANEL BY RT-PCR (RSV, FLU A&B, COVID)  [id number]  COMPREHENSIVE METABOLIC PANEL WITH GFR  LIPASE, BLOOD  TSH  HCG, SERUM, QUALITATIVE  URINALYSIS, ROUTINE W REFLEX MICROSCOPIC  URINE DRUG SCREEN  T4, FREE  TROPONIN T, HIGH SENSITIVITY  TROPONIN T, HIGH SENSITIVITY    EKG: EKG Interpretation Date/Time:  Thursday March 25 2024 09:50:30 EST Ventricular Rate:  69 PR Interval:  151 QRS Duration:  96 QT Interval:  411 QTC Calculation: 441 R Axis:   88  Text Interpretation: Sinus arrhythmia no acute ST/T changes Confirmed by Freddi Hamilton [phone]) on 03/25/2024 10:02:10 AM  Radiology: [facility] Chest Port 1 View Result Date: 03/25/2024 CLINICAL DATA:  Cough. EXAM: PORTABLE CHEST 1 VIEW COMPARISON:  10/05/2020 FINDINGS: Lungs are adequately inflated without focal airspace consolidation or effusion. Cardiomediastinal silhouette is normal. Surgical clips over the neck compatible with previous thyroidectomy. Remainder of the exam is unchanged. IMPRESSION: No active disease. Electronically Signed   By: Toribio Agreste M.D.   On: 03/25/2024 10:24     Procedures   Medications Ordered in the ED  sodium chloride  0.9 % bolus 1,000 mL (0 mLs Intravenous Stopped 03/25/24 1153)  metoCLOPramide  (REGLAN ) injection 10 mg (10 mg Intravenous Given 03/25/24 1010)                                    Medical Decision Making Amount and/or Complexity of Data Reviewed External Data Reviewed: notes. Labs: ordered.    Details: Leukocytosis Radiology: ordered and  independent interpretation performed.    Details: No pneumonia ECG/medicine tests: ordered and independent interpretation performed.    Details: No ischemia  Risk Prescription drug management.   Patient presents with vomiting and reported fever at home.  Work appears unremarkable.  Her oropharynx is clear.  I doubt she has recurrent strep throat based on a benign exam.  Workup was pending but then she was feeling better and wanted to leave.  Urine and some other test were still pending but she states she feels much better and is asking for discharge.  Discussed limitations in her evaluation if she leaves early.  She does have a benign  abdominal exam and I do not think CT is warranted.  Most likely a viral process.  I do not think she has a thyroid  emergency at this time.  Will advise her she can return if any symptoms worsen or recur.     Final diagnoses:  Acute vomiting    ED Discharge Orders          Ordered    ondansetron  (ZOFRAN -ODT) 4 MG disintegrating tablet  Every 8 hours PRN        03/25/24 1211               Freddi Hamilton, MD 03/25/24 1505  "
